# Patient Record
Sex: Male | Born: 1965
Health system: Southern US, Community
[De-identification: ages and names within clinical notes are randomized; demographics above are authoritative.]

## PROBLEM LIST (undated history)

## (undated) DIAGNOSIS — E785 Hyperlipidemia, unspecified: Secondary | ICD-10-CM

## (undated) HISTORY — DX: Morbid (severe) obesity due to excess calories: E66.01

## (undated) HISTORY — DX: Hyperlipidemia, unspecified: E78.5

## (undated) HISTORY — PX: TONSILLECTOMY AND ADENOIDECTOMY: SUR1326

---

## 2015-03-05 ENCOUNTER — Ambulatory Visit (INDEPENDENT_AMBULATORY_CARE_PROVIDER_SITE_OTHER): Payer: BLUE CROSS/BLUE SHIELD | Admitting: Neurology

## 2015-03-05 ENCOUNTER — Encounter: Payer: Self-pay | Admitting: Neurology

## 2015-03-05 VITALS — BP 124/82 | HR 62 | Resp 18 | Ht 71.0 in | Wt 266.0 lb

## 2015-03-05 DIAGNOSIS — G4719 Other hypersomnia: Secondary | ICD-10-CM

## 2015-03-05 DIAGNOSIS — R51 Headache: Secondary | ICD-10-CM | POA: Diagnosis not present

## 2015-03-05 DIAGNOSIS — E669 Obesity, unspecified: Secondary | ICD-10-CM | POA: Diagnosis not present

## 2015-03-05 DIAGNOSIS — R351 Nocturia: Secondary | ICD-10-CM

## 2015-03-05 DIAGNOSIS — G4733 Obstructive sleep apnea (adult) (pediatric): Secondary | ICD-10-CM

## 2015-03-05 DIAGNOSIS — R519 Headache, unspecified: Secondary | ICD-10-CM

## 2015-03-05 NOTE — Progress Notes (Addendum)
Subjective:    Patient ID: Derek Villegas is a 49 y.o. male.  HPI     Huston Foley, MD, PhD [facility] [address] [address] [place], [place] [address]  Dear Sharlet Salina and Dr. Phillips Odor,  I saw your patient, Derek Villegas, upon your kind request in my neurologic clinic today for initial consultation of his sleep disorder, in particular, concern for underlying obstructive sleep apnea. The patient is unaccompanied today. As you know, Mr. Febo is a 49 year old right-handed gentleman with an underlying medical history of anxiety and hyperlipidemia, and obesity, who reports snoring and excessive daytime somnolence. He does not wake up rested. His wife has witnessed breathing pauses while he is asleep. Symptoms have been ongoing for about 3-4 years. He has rare morning headaches and has nocturia usually once per night. He denies frank restless leg symptoms and is not sure if he twitches his legs in his sleep. He has no family history of obstructive sleep apnea. He had a tonsillectomy when he was in third grade. He lives at home with his wife and 31 year old daughter. 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 Bedtime is typically between 10 and 10:30 PM. Rise time is usually at 5 AM. His Epworth sleepiness score is 12 out of 24. His fatigue scores 36 out of 63. He does not typically take any scheduled naps but has fallen asleep when he is sedentary at home. He does not smoke but uses smokeless tobacco, 2-3 cans per day. He does not drink alcohol and quit when his daughter was born. He drinks green see about 20 ounces per day and otherwise water. He does not watch TV in bed. He is trying to lose weight. He has lost about 10 pounds in the last couple months. He had overnight pulse oximetry test at your office in August 2016. We will request test results. He was told that he had drops in his oxygen saturation at  night.  03/05/2015: I received the overnight pulse oximetry test results from 01/10/2015: Total valid sampling time was 7 hours and 5 minutes, average oxygen saturation of only 87.6%, nadir was 69%, average pulse rate was 71, lowest pulse rate was 50, highest pulse rate was 113, time below 90% saturation was 4 hours and 20 minutes, time below 89% saturation was 3 hours and 38 minutes. Based on the test results he has significant nocturnal hypoxemia.  His Past Medical History Is Significant For: Past Medical History  Diagnosis Date  . Obesity, morbid   . Hyperlipemia     His Past Surgical History Is Significant For: Past Surgical History  Procedure Laterality Date  . Tonsillectomy and adenoidectomy      His Family History Is Significant For: Family History  Problem Relation Age of Onset  . Kidney failure Mother   . Hypertension Mother   . Diabetes Mother   . Hypertension Father   . Diabetes Father   . Hypertension Brother   . Diabetes Brother     His Social History Is Significant For: Social History   Social History  . Marital Status: Married    Spouse Name: N/A  . Number of Children: 1  . Years of Education: 12   Occupational History  . [employer] service manager     Social History Main Topics  . Smoking status: Former Games developer  . Smokeless tobacco: Current User  . Alcohol Use: No  .  Drug Use: No  . Sexual Activity: Not Asked   Other Topics Concern  . None   Social History Narrative   Denies caffeine use    His Allergies Are:  No Known Allergies:   His Current Medications Are:  Outpatient Encounter Prescriptions as of 03/05/2015  Medication Sig  . citalopram (CELEXA) 40 MG tablet Take 40 mg by mouth daily.  . pravastatin (PRAVACHOL) 20 MG tablet Take 20 mg by mouth daily.   No facility-administered encounter medications on file as of 03/05/2015.  :  Review of Systems:  Out of a complete 14 point review of systems, all are reviewed and negative with the  exception of these symptoms as listed below:   Review of Systems  Constitutional: Positive for fatigue.  Neurological:       No trouble falling or staying asleep, snoring, witnessed apnea, wakes up feeling tired, daytime tiredness, takes nap after work, sometimes wakes up with headaches.   Hematological: Bruises/bleeds easily.  Psychiatric/Behavioral:       Not enough sleep, decreased energy     Objective:  Neurologic Exam  Physical Exam Physical Examination:   Filed Vitals:   03/05/15 1349  BP: 124/82  Pulse: 62  Resp: 18    General Examination: The patient is a very pleasant 49 y.o. male in no acute distress. He appears well-developed and well-nourished and adequately groomed.   HEENT: Normocephalic, atraumatic, pupils are equal, round and reactive to light and accommodation. Funduscopic exam is normal with sharp disc margins noted. Extraocular tracking is good without limitation to gaze excursion or nystagmus noted. Normal smooth pursuit is noted. Hearing is grossly intact. Tympanic membranes are clear bilaterally. Face is symmetric with normal facial animation and normal facial sensation. Speech is clear with no dysarthria noted. There is no hypophonia. There is no lip, neck/head, jaw or voice tremor. Neck is supple with full range of passive and active motion. There are no carotid bruits on auscultation. Oropharynx exam reveals: mild mouth dryness, adequate marginal dental hygiene and moderate airway crowding, due to redundant soft palate, larger tongue, possible residual tonsillar tissue on the right and larger uvula. Mallampati is class II. Tongue protrudes centrally and palate elevates symmetrically. Tonsils are absent. Neck size is 17.25 inches. He has a Mild overbite. Nasal inspection reveals no significant nasal mucosal bogginess or redness and no septal deviation.   Chest: Clear to auscultation without wheezing, rhonchi or crackles noted.  Heart: S1+S2+0, regular and normal  without murmurs, rubs or gallops noted.   Abdomen: Soft, non-tender and non-distended with normal bowel sounds appreciated on auscultation.  Extremities: There is no pitting edema in the distal lower extremities bilaterally. Pedal pulses are intact.  Skin: Warm and dry without trophic changes noted. There are no varicose veins.  Musculoskeletal: exam reveals no obvious joint deformities, tenderness or joint swelling or erythema.   Neurologically:  Mental status: The patient is awake, alert and oriented in all 4 spheres. His immediate and remote memory, attention, language skills and fund of knowledge are appropriate. There is no evidence of aphasia, agnosia, apraxia or anomia. Speech is clear with normal prosody and enunciation. Thought process is linear. Mood is normal and affect is normal.  Cranial nerves II - XII are as described above under HEENT exam. In addition: shoulder shrug is normal with equal shoulder height noted. Motor exam: Normal bulk, strength and tone is noted. There is no drift, tremor or rebound. Romberg is negative. Reflexes are 2+ throughout. Babinski: Toes are flexor  bilaterally. Fine motor skills and coordination: intact with normal finger taps, normal hand movements, normal rapid alternating patting, normal foot taps and normal foot agility.  Cerebellar testing: No dysmetria or intention tremor on finger to nose testing. Heel to shin is unremarkable bilaterally. There is no truncal or gait ataxia.  Sensory exam: intact to light touch, pinprick, vibration, temperature sense in the upper and lower extremities.  Gait, station and balance: He stands easily. No veering to one side is noted. No leaning to one side is noted. Posture is age-appropriate and stance is narrow based. Gait shows normal stride length and normal pace. No problems turning are noted. He turns en bloc. Tandem walk is unremarkable.   Assessment and Plan:   In summary, JAMERE STIDHAM is a very pleasant 49  y.o.-year old male with an underlying medical history of anxiety and hyperlipidemia, and obesity, whose history and physical exam are in keeping with obstructive sleep apnea (OSA). I had a long chat with the patient about my findings and the diagnosis of OSA, its prognosis and treatment options. We talked about medical treatments, surgical interventions and non-pharmacological approaches. I explained in particular the risks and ramifications of untreated moderate to severe OSA, especially with respect to developing cardiovascular disease down the Road, including congestive heart failure, difficult to treat hypertension, cardiac arrhythmias, or stroke. Even type 2 diabetes has, in part, been linked to untreated OSA. Symptoms of untreated OSA include daytime sleepiness, memory problems, mood irritability and mood disorder such as depression and anxiety, lack of energy, as well as recurrent headaches, especially morning headaches. We talked about tobacco cessation, and trying to maintain a healthy lifestyle in general, as well as the importance of weight control. I encouraged the patient to eat healthy, exercise daily and keep well hydrated, to keep a scheduled bedtime and wake time routine, to not skip any meals and eat healthy snacks in between meals. I advised the patient not to drive when feeling sleepy. I recommended the following at this time: sleep study with potential positive airway pressure titration. (We will score hypopneas at 3% and split the sleep study into diagnostic and treatment portion, if the estimated. 2 hour AHI is >15/h).  We will also request his overnight pulse oximetry test results from your office from August 2016. Of note, I reviewed the test results later which indicate nocturnal hypoxemia.  I explained the sleep test procedure to the patient and also outlined possible surgical and non-surgical treatment options of OSA, including the use of a custom-made dental device (which would  require a referral to a specialist dentist or oral surgeon), upper airway surgical options, such as pillar implants, radiofrequency surgery, tongue base surgery, and UPPP (which would involve a referral to an ENT surgeon). Rarely, jaw surgery such as mandibular advancement may be considered.  I also explained the CPAP treatment option to the patient, who indicated that he would be willing to try CPAP if the need arises. I explained the importance of being compliant with PAP treatment, not only for insurance purposes but primarily to improve His symptoms, and for the patient's long term health benefit, including to reduce His cardiovascular risks. I answered all his questions today and the patient was in agreement. I would like to see him back after the sleep study is completed and encouraged him to call with any interim questions, concerns, problems or updates.   Thank you very much for allowing me to participate in the care of this nice patient. If  I can be of any further assistance to you please do not hesitate to call me at [phone].  Sincerely,   Star Age, MD, PhD

## 2015-03-05 NOTE — Patient Instructions (Signed)

## 2015-03-07 ENCOUNTER — Ambulatory Visit (INDEPENDENT_AMBULATORY_CARE_PROVIDER_SITE_OTHER): Payer: BLUE CROSS/BLUE SHIELD | Admitting: Neurology

## 2015-03-07 DIAGNOSIS — G4720 Circadian rhythm sleep disorder, unspecified type: Secondary | ICD-10-CM

## 2015-03-07 DIAGNOSIS — G479 Sleep disorder, unspecified: Secondary | ICD-10-CM

## 2015-03-07 DIAGNOSIS — G4733 Obstructive sleep apnea (adult) (pediatric): Secondary | ICD-10-CM | POA: Diagnosis not present

## 2015-03-08 NOTE — Sleep Study (Signed)
Please see the scanned sleep study interpretation located in the Procedure tab within the Chart Review section. 

## 2015-03-12 ENCOUNTER — Telehealth: Payer: Self-pay | Admitting: Neurology

## 2015-03-12 DIAGNOSIS — G4733 Obstructive sleep apnea (adult) (pediatric): Secondary | ICD-10-CM

## 2015-03-12 NOTE — Telephone Encounter (Signed)
Patient was referred by Sharlet Salina Mann/Dr. Phillips Odor, seen by me on 03/05/15, split night study on 03/07/15, ins: [employer]:  [place]:   Please call and notify patient that the recent sleep study confirmed the diagnosis of severe OSA. He did well with CPAP during the study with significant improvement of the respiratory events. Therefore, I would like start the patient on CPAP therapy at home by prescribing a machine for home use. I placed the order in the chart. The patient will need a follow up appointment with me in 8 to 10 weeks post set up that has to be scheduled; please go ahead and schedule while you have the patient on the phone and make sure patient understands the importance of keeping this window for the FU appointment, as it is often an insurance requirement and failing to adhere to this may result in losing coverage for sleep apnea treatment.   Please re-enforce the importance of compliance with treatment and the need for [place] to monitor compliance data - again an insurance requirement and good feedback for the patient as far as how they are doing.  Also remind patient, that any upcoming CPAP machine or mask issues, should be first addressed with the DME company. Please ask if patient has a preference regarding DME company.  Please arrange for CPAP set up at home through a DME company of patient's choice - once you have spoken to the patient - and faxed/routed report to PCP and referring MD (if other than PCP), you can close this encounter, thanks,   Huston Foley, MD, PhD [facility]

## 2015-03-13 NOTE — Telephone Encounter (Signed)
I spoke to patient and he is aware of results and recommendation. He would like to start CPAP and use [facility]. I will send orders to them. We made f/u appt in December. I will send patient a letter reminding him importance of compliance. I will fax study to PCP.

## 2015-03-13 NOTE — Telephone Encounter (Signed)
Left message to call back  

## 2015-03-13 NOTE — Telephone Encounter (Signed)
Patient returned your call.

## 2015-03-13 NOTE — Telephone Encounter (Signed)
Left message to cal back 

## 2015-05-14 ENCOUNTER — Telehealth: Payer: Self-pay

## 2015-05-14 ENCOUNTER — Ambulatory Visit: Payer: Self-pay | Admitting: Neurology

## 2015-05-14 NOTE — Telephone Encounter (Signed)
Patient did not show to appt today  

## 2015-05-15 ENCOUNTER — Encounter: Payer: Self-pay | Admitting: Neurology

## 2015-05-22 ENCOUNTER — Encounter: Payer: Self-pay | Admitting: Neurology

## 2015-05-22 ENCOUNTER — Ambulatory Visit (INDEPENDENT_AMBULATORY_CARE_PROVIDER_SITE_OTHER): Payer: BLUE CROSS/BLUE SHIELD | Admitting: Neurology

## 2015-05-22 VITALS — BP 140/82 | HR 70 | Resp 20 | Ht 71.0 in | Wt 270.0 lb

## 2015-05-22 DIAGNOSIS — R519 Headache, unspecified: Secondary | ICD-10-CM

## 2015-05-22 DIAGNOSIS — E669 Obesity, unspecified: Secondary | ICD-10-CM

## 2015-05-22 DIAGNOSIS — R351 Nocturia: Secondary | ICD-10-CM | POA: Diagnosis not present

## 2015-05-22 DIAGNOSIS — G4733 Obstructive sleep apnea (adult) (pediatric): Secondary | ICD-10-CM | POA: Diagnosis not present

## 2015-05-22 DIAGNOSIS — R51 Headache: Secondary | ICD-10-CM | POA: Diagnosis not present

## 2015-05-22 NOTE — Patient Instructions (Signed)

## 2015-05-22 NOTE — Progress Notes (Signed)
Subjective:    Patient ID: Derek Villegas is a 49 y.o. male.  HPI     Interim history:   Derek Villegas is a 49 year old right-handed gentleman with an underlying medical history of anxiety and hyperlipidemia, and obesity, who presents for follow-up consultation of his obstructive sleep apnea, after his recent sleep study, and referral for CPAP therapy at home. The patient is unaccompanied today. Of note, he no showed for an appointment on 05/14/15. I first met him on 03/05/2015 at the request of his primary care physician, at which time he reported snoring and excessive daytime somnolence. I invited him back for sleep study. He had a split-night sleep study on 03/07/2015 and I went over his test results with him in detail today. His baseline sleep efficiency was 74.7% with a sleep latency of 15 minutes and wake after sleep onset of 14 minutes. He had absence of slow-wave sleep and REM sleep was 32.5% with a normal REM latency. He had no significant PLMS, EKG or EEG changes. He had moderate to loud snoring. Total AHI was elevated at 52.7 per hour, average oxygen saturation 92%, nadir was 79%. Time below 88% saturation was 23 minutes. He was then titrated on CPAP. Sleep efficiency was 84.6% during the second part of the study. Sleep latency 6 minutes and wake after sleep onset was 30 minutes. He had absence of slow-wave sleep and REM sleep was 12%. Average oxygen saturation was 93%, nadir was 84%, time below 88% saturation was 1 minute and 44 seconds, he had no significant PLMS. CPAP was titrated from 5 cm to 10 cm of water pressure and his AHI was slightly elevated at 7.7 per hour at the final pressure. Based on his test results I suggested we start him on CPAP therapy at a pressure of 11 cm.  Today, 05/22/2015: I reviewed his CPAP compliance data from 04/09/2015 through 05/08/2015 which is a total of 30 days during which time he used his machine only 5 days with percent used days greater than 4 hours of only  17%, indicating poor compliance with an average usage of only 1 hour and 7 minutes, residual AHI 6.5 per hour, leak at times high with the 95th percentile at 42 L/m on a pressure of 11 cm with EPR of 3. His 50 day compliance data from 03/20/2015 through 05/08/2015 shows a compliance percentage of 32%. AHI was 5 per hour, leak more acceptable at the time.  Today, 05/22/2015: He reports that he had no problems using the CPAP but he admits that he has lapsed in treatment. He actually endorses sleeping better and waking up better rested when he did use his CPAP. He has essentially stopped using it on 05/08/2015. He really had big gaps within the last 30 day download. He has gained a little bit of weight. He has no new medical issues. He has had no recent medication changes. He continues to work full-time, Games developer. He endorses nocturia and morning headaches.  Previously:  03/05/2015: He reports snoring and excessive daytime somnolence. He does not wake up rested. His wife has witnessed breathing pauses while he is asleep. Symptoms have been ongoing for about 3-4 years. He has rare morning headaches and has nocturia usually once per night. He denies frank restless leg symptoms and is not sure if he twitches his legs in his sleep. He has no family history of obstructive sleep apnea. He had a tonsillectomy when he was in third grade. He lives at home with his  wife and 53 year old daughter. 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 Bedtime is typically between 10 and 10:30 PM. Rise time is usually at 5 AM. His Epworth sleepiness score is 12 out of 24. His fatigue scores 36 out of 63. He does not typically take any scheduled naps but has fallen asleep when he is sedentary at home. He does not smoke but uses smokeless tobacco, 2-3 cans per day. He does not drink alcohol and quit when his daughter was born. He drinks green see about 20 ounces per day and  otherwise water. He does not watch TV in bed. He is trying to lose weight. He has lost about 10 pounds in the last couple months. He had overnight pulse oximetry test at your office in August 2016. We will request test results. He was told that he had drops in his oxygen saturation at night.  03/05/2015: I received the overnight pulse oximetry test results from 01/10/2015: Total valid sampling time was 7 hours and 5 minutes, average oxygen saturation of only 87.6%, nadir was 69%, average pulse rate was 71, lowest pulse rate was 50, highest pulse rate was 113, time below 90% saturation was 4 hours and 20 minutes, time below 89% saturation was 3 hours and 38 minutes. Based on the test results he has significant nocturnal hypoxemia.  His Past Medical History Is Significant For: Past Medical History  Diagnosis Date  . Obesity, morbid ([place])   . Hyperlipemia     His Past Surgical History Is Significant For: Past Surgical History  Procedure Laterality Date  . Tonsillectomy and adenoidectomy      His Family History Is Significant For: Family History  Problem Relation Age of Onset  . Kidney failure Mother   . Hypertension Mother   . Diabetes Mother   . Hypertension Father   . Diabetes Father   . Hypertension Brother   . Diabetes Brother     His Social History Is Significant For: Social History   Social History  . Marital Status: Married    Spouse Name: N/A  . Number of Children: 1  . Years of Education: 12   Occupational History  . [employer] manager     Social History Main Topics  . Smoking status: Former Research scientist (life sciences)  . Smokeless tobacco: Current User  . Alcohol Use: No  . Drug Use: No  . Sexual Activity: Not Asked   Other Topics Concern  . None   Social History Narrative   Denies caffeine use    His Allergies Are:  No Known Allergies:   His Current Medications Are:  Outpatient Encounter Prescriptions as of 05/22/2015  Medication Sig  . citalopram (CELEXA) 40 MG  tablet Take 40 mg by mouth daily.  . pravastatin (PRAVACHOL) 20 MG tablet Take 20 mg by mouth daily.   No facility-administered encounter medications on file as of 05/22/2015.  :  Review of Systems:  Out of a complete 14 point review of systems, all are reviewed and negative with the exception of these symptoms as listed below:  Review of Systems  Neurological:       Patient is here for initial CPAP f/u. Feels like he is doing well on it, no new concerns.     Objective:  Neurologic Exam  Physical Exam Physical Examination:   Filed Vitals:   05/22/15 1257  BP: 140/82  Pulse: 70  Resp: 20  General Examination: The patient is a very pleasant 49 y.o. male in no acute distress. He appears well-developed and well-nourished and adequately groomed. He is in good spirits today.  HEENT: Normocephalic, atraumatic, pupils are equal, round and reactive to light and accommodation. Funduscopic exam is normal with sharp disc margins noted. Extraocular tracking is good without limitation to gaze excursion or nystagmus noted. Normal smooth pursuit is noted. Hearing is grossly intact. Face is symmetric with normal facial animation and normal facial sensation. Speech is clear with no dysarthria noted. There is no hypophonia. There is no lip, neck/head, jaw or voice tremor. Neck is supple with full range of passive and active motion. There are no carotid bruits on auscultation. Oropharynx exam reveals: mild mouth dryness, adequate marginal dental hygiene and moderate airway crowding, due to redundant soft palate, larger tongue, possible residual tonsillar tissue on the right and larger uvula. Mallampati is class II. Tongue protrudes centrally and palate elevates symmetrically. Tonsils are absent. He has a Mild overbite. Nasal inspection reveals no significant nasal mucosal bogginess or redness and no septal deviation.   Chest: Clear to auscultation without wheezing, rhonchi or crackles noted.  Heart:  S1+S2+0, regular and normal without murmurs, rubs or gallops noted.   Abdomen: Soft, non-tender and non-distended with normal bowel sounds appreciated on auscultation.  Extremities: There is no pitting edema in the distal lower extremities bilaterally. Pedal pulses are intact.  Skin: Warm and dry without trophic changes noted. There are no varicose veins.  Musculoskeletal: exam reveals no obvious joint deformities, tenderness or joint swelling or erythema.   Neurologically:  Mental status: The patient is awake, alert and oriented in all 4 spheres. His immediate and remote memory, attention, language skills and fund of knowledge are appropriate. There is no evidence of aphasia, agnosia, apraxia or anomia. Speech is clear with normal prosody and enunciation. Thought process is linear. Mood is normal and affect is normal.  Cranial nerves II - XII are as described above under HEENT exam. In addition: shoulder shrug is normal with equal shoulder height noted. Motor exam: Normal bulk, strength and tone is noted. There is no drift, tremor or rebound. Romberg is negative. Reflexes are 2+ throughout. Babinski: Toes are flexor bilaterally. Fine motor skills and coordination: intact.  Sensory exam: intact to light touch, vibration, temperature sense in the upper and lower extremities.  Gait, station and balance: He stands easily. No veering to one side is noted. No leaning to one side is noted. Posture is age-appropriate and stance is narrow based. Gait shows normal stride length and normal pace. No problems turning are noted. He turns en bloc. Tandem walk is unremarkable.   Assessment and Plan:   In summary, Derek Villegas is a very pleasant 49 year old male with an underlying medical history of anxiety and hyperlipidemia, and obesity, who presents for follow-up consultation of his severe obstructive sleep apnea. He had a split-night sleep study in September 2016 we talked about the test results in detail.  We talked about his compliance and compliance data download as well. He's currently noncompliant with treatment but motivated to restart treatment. He actually endorses feeling better when he was using his CPAP. He is advised about the risks and ramifications of  untreated moderate to severe OSA, especially with respect to developing cardiovascular disease down the Road, including congestive heart failure, difficult to treat hypertension, cardiac arrhythmias, or stroke. Even type 2 diabetes has, in part, been linked to untreated OSA. Symptoms of untreated OSA include  daytime sleepiness, memory problems, mood irritability and mood disorder such as depression and anxiety, lack of energy, as well as recurrent headaches, especially morning headaches. We talked about tobacco cessation, and trying to maintain a healthy lifestyle in general, as well as the importance of weight control. I encouraged the patient to eat healthy, exercise daily and keep well hydrated, to keep a scheduled bedtime and wake time routine, to not skip any meals and eat healthy snacks in between meals. I advised the patient not to drive when feeling sleepy. I recommended the following at this time: We start CPAP therapy and strive for weight loss. Thankfully, he was able to tolerate the mask and the pressure and does not have any problems using CPAP and is motivated to restart it.  Alternative treatments may include upper airway surgery or a custom-made dental device but the patient is willing to retry CPAP therapy which we will pursue at this time. I explained the importance of being compliant with PAP treatment, not only for insurance purposes but primarily to improve His symptoms, and for the patient's long term health benefit, including to reduce His cardiovascular risks. I would like to see him back in 3 months, sooner if the need arises. I answered all his questions today and the patient was in agreement. I encouraged him to call with any  interim questions, concerns, problems or updates.  I spent 20 minutes in total face-to-face time with the patient, more than 50% of which was spent in counseling and coordination of care, reviewing test results, reviewing medication and discussing or reviewing the diagnosis of OSA, its prognosis and treatment options.

## 2015-06-16 ENCOUNTER — Emergency Department (HOSPITAL_COMMUNITY)
Admission: EM | Admit: 2015-06-16 | Discharge: 2015-06-16 | Disposition: A | Discharge status: Home / Self Care | Payer: BLUE CROSS/BLUE SHIELD | Attending: Emergency Medicine | Admitting: Emergency Medicine

## 2015-06-16 ENCOUNTER — Encounter (HOSPITAL_COMMUNITY): Payer: Self-pay | Admitting: Emergency Medicine

## 2015-06-16 ENCOUNTER — Emergency Department (HOSPITAL_COMMUNITY): Payer: BLUE CROSS/BLUE SHIELD

## 2015-06-16 DIAGNOSIS — W009XXA Unspecified fall due to ice and snow, initial encounter: Secondary | ICD-10-CM | POA: Diagnosis not present

## 2015-06-16 DIAGNOSIS — S99912A Unspecified injury of left ankle, initial encounter: Secondary | ICD-10-CM | POA: Diagnosis present

## 2015-06-16 DIAGNOSIS — Y9389 Activity, other specified: Secondary | ICD-10-CM | POA: Insufficient documentation

## 2015-06-16 DIAGNOSIS — Z87891 Personal history of nicotine dependence: Secondary | ICD-10-CM | POA: Diagnosis not present

## 2015-06-16 DIAGNOSIS — Y9289 Other specified places as the place of occurrence of the external cause: Secondary | ICD-10-CM | POA: Diagnosis not present

## 2015-06-16 DIAGNOSIS — E785 Hyperlipidemia, unspecified: Secondary | ICD-10-CM | POA: Diagnosis not present

## 2015-06-16 DIAGNOSIS — S82832A Other fracture of upper and lower end of left fibula, initial encounter for closed fracture: Secondary | ICD-10-CM | POA: Diagnosis not present

## 2015-06-16 DIAGNOSIS — Y998 Other external cause status: Secondary | ICD-10-CM | POA: Insufficient documentation

## 2015-06-16 DIAGNOSIS — Z79899 Other long term (current) drug therapy: Secondary | ICD-10-CM | POA: Insufficient documentation

## 2015-06-16 MED ORDER — OXYCODONE-ACETAMINOPHEN 5-325 MG PO TABS: 1.0000 | ORAL_TABLET | Freq: Four times a day (QID) | ORAL | Status: DC | PRN

## 2015-06-16 NOTE — ED Notes (Signed)
PT c/o slipping on ice outside and c/o left ankle pain from fall. PT able to bear weight and ambulate in triage.

## 2015-06-16 NOTE — Discharge Instructions (Signed)
Ankle Fracture A fracture is a break in a bone. The ankle joint is made up of three bones. These include the lower (distal)sections of your lower leg bones, called the tibia and fibula, along with a bone in your foot, called the talus. Depending on how bad the break is and if more than one ankle joint bone is broken, a cast or splint is used to protect and keep your injured bone from moving while it heals. Sometimes, surgery is required to help the fracture heal properly.  There are two general types of fractures:  Stable fracture. This includes a single fracture line through one bone, with no injury to ankle ligaments. A fracture of the talus that does not have any displacement (movement of the bone on either side of the fracture line) is also stable.  Unstable fracture. This includes more than one fracture line through one or more bones in the ankle joint. It also includes fractures that have displacement of the bone on either side of the fracture line. CAUSES  A direct blow to the ankle.   Quickly and severely twisting your ankle.  Trauma, such as a car accident or falling from a significant height. RISK FACTORS You may be at a higher risk of ankle fracture if:  You have certain medical conditions.  You are involved in high-impact sports.  You are involved in a high-impact car accident. SIGNS AND SYMPTOMS   Tender and swollen ankle.  Bruising around the injured ankle.  Pain on movement of the ankle.  Difficulty walking or putting weight on the ankle.  A cold foot below the site of the ankle injury. This can occur if the blood vessels passing through your injured ankle were also damaged.  Numbness in the foot below the site of the ankle injury. DIAGNOSIS  An ankle fracture is usually diagnosed with a physical exam and X-rays. A CT scan may also be required for complex fractures. TREATMENT  Stable fractures are treated with a cast or splint and using crutches to avoid putting  weight on your injured ankle. This is followed by an ankle strengthening program. Some patients require a special type of cast, depending on other medical problems they may have. Unstable fractures require surgery to ensure the bones heal properly. Your health care provider will tell you what type of fracture you have and the best treatment for your condition. HOME CARE INSTRUCTIONS   Review correct crutch use with your health care provider and use your crutches as directed. Safe use of crutches is extremely important. Misuse of crutches can cause you to fall or cause injury to nerves in your hands or armpits.  Do not put weight or pressure on the injured ankle until directed by your health care provider.  To lessen the swelling, keep the injured leg elevated while sitting or lying down.  Apply ice to the injured area:  Put ice in a plastic bag.  Place a towel between your cast and the bag.  Leave the ice on for 20 minutes, 2-3 times a day.  If you have a plaster or fiberglass cast:  Do not try to scratch the skin under the cast with any objects. This can increase your risk of skin infection.  Check the skin around the cast every day. You may put lotion on any red or sore areas.  Keep your cast dry and clean.  If you have a plaster splint:  Wear the splint as directed.  You may loosen the elastic   around the splint if your toes become numb, tingle, or turn cold or blue.  Do not put pressure on any part of your cast or splint; it may break. Rest your cast only on a pillow the first 24 hours until it is fully hardened.  Your cast or splint can be protected during bathing with a plastic bag sealed to your skin with medical tape. Do not lower the cast or splint into water.  Take medicines as directed by your health care provider. Only take over-the-counter or prescription medicines for pain, discomfort, or fever as directed by your health care provider.  Do not drive a vehicle until  your health care provider specifically tells you it is safe to do so.  If your health care provider has given you a follow-up appointment, it is very important to keep that appointment. Not keeping the appointment could result in a chronic or permanent injury, pain, and disability. If you have any problem keeping the appointment, call the facility for assistance. SEEK MEDICAL CARE IF: You develop increased swelling or discomfort. SEEK IMMEDIATE MEDICAL CARE IF:   Your cast gets damaged or breaks.  You have continued severe pain.  You develop new pain or swelling after the cast was put on.  Your skin or toenails below the injury turn blue or gray.  Your skin or toenails below the injury feel cold, numb, or have loss of sensitivity to touch.  There is a bad smell or pus draining from under the cast. MAKE SURE YOU:   Understand these instructions.  Will watch your condition.  Will get help right away if you are not doing well or get worse.   This information is not intended to replace advice given to you by your health care provider. Make sure you discuss any questions you have with your health care provider.   Document Released: 05/22/2000 Document Revised: 05/30/2013 Document Reviewed: 12/22/2012 Elsevier Interactive Patient Education 2016 [employer]  

## 2015-06-16 NOTE — ED Provider Notes (Signed)
CSN: [id number]     Arrival date & time 06/16/15  1426 History   First MD Initiated Contact with Patient 06/16/15 1503     Chief Complaint  Patient presents with  . Ankle Injury     Patient is a 50 y.o. male presenting with lower extremity injury. The history is provided by the patient.  Ankle Injury Pertinent negatives include no chest pain and no abdominal pain.   patient presents with left ankle pain. States he twisted it stepping on the ice today. States that it bent backwards. No other injury. No numbness or weakness. States he's been able to walk on it but there is pain. Patient is on the outside of the left ankle. There is some swelling.  Past Medical History  Diagnosis Date  . Obesity, morbid ([facility])   . Hyperlipemia    Past Surgical History  Procedure Laterality Date  . Tonsillectomy and adenoidectomy     Family History  Problem Relation Age of Onset  . Kidney failure Mother   . Hypertension Mother   . Diabetes Mother   . Hypertension Father   . Diabetes Father   . Hypertension Brother   . Diabetes Brother    Social History  Substance Use Topics  . Smoking status: Former Games developer  . Smokeless tobacco: Current User  . Alcohol Use: No    Review of Systems  Constitutional: Negative for appetite change.  Respiratory: Negative for chest tightness.   Cardiovascular: Negative for chest pain.  Gastrointestinal: Negative for abdominal pain.  Genitourinary: Negative for dysuria.  Musculoskeletal: Negative for neck pain.  Neurological: Negative for weakness and numbness.      Allergies  Review of patient's allergies indicates no known allergies.  Home Medications   Prior to Admission medications   Medication Sig Start Date End Date Taking? Authorizing Provider  citalopram (CELEXA) 40 MG tablet Take 40 mg by mouth daily.    Historical Provider, MD  oxyCODONE-acetaminophen (PERCOCET/ROXICET) 5-325 MG tablet Take 1-2 tablets by mouth every 6 (six) hours as needed for  severe pain. 06/16/15   Benjiman Core, MD  pravastatin (PRAVACHOL) 20 MG tablet Take 20 mg by mouth daily.    Historical Provider, MD   BP 131/85 mmHg  Pulse 86  Temp(Src) 98 F (36.7 C) (Oral)  Resp 18  Ht 5\' 11"  (1.803 m)  Wt 260 lb (117.935 kg)  BMI 36.28 kg/m2  SpO2 95% Physical Exam  Constitutional: He appears well-developed.  HENT:  Head: Atraumatic.  Musculoskeletal: He exhibits tenderness.  Tenderness and swelling to left ankle laterally. Mild deformity. Strong dorsalis pedis pulse. Range of motion intact ankle. Sensation intact over foot with good capillary refill.  Neurological: He is alert.  Skin: Skin is warm.    ED Course  Procedures (including critical care time) Labs Review Labs Reviewed - No data to display  Imaging Review Dg Ankle Complete Left  06/16/2015  CLINICAL DATA:  Status post slip and fall on ice today with a left ankle injury pain. Initial encounter. EXAM: LEFT ANKLE COMPLETE - 3+ VIEW COMPARISON:  None. FINDINGS: The patient has a nondisplaced distal fibular fracture with associated soft tissue swelling. Tiny bony fragment projecting along the medial process of the talus is compatible with an avulsion fracture. No other acute bony or joint abnormality is seen. Pes planus deformity is noted. Talonavicular degenerative change is seen. There is a plantar calcaneal spur. IMPRESSION: Nondisplaced distal fibular fracture. Tiny avulsion fracture likely off the medial process of the talus  is also seen. Pes planus. Plantar calcaneal spur. Electronically Signed   By: Drusilla Kannerhomas  Dalessio M.D.   On: 06/16/2015 15:05   I have personally reviewed and evaluated these images and lab results as part of my medical decision-making.   EKG Interpretation None      MDM   Final diagnoses:  Fracture of distal end of left fibula    Patient with ankle fracture. Close. Cam walker and weight bear as tolerated.     Benjiman CoreNathan Larrisa Cravey, MD 06/16/15 [phone]

## 2015-06-18 ENCOUNTER — Ambulatory Visit (INDEPENDENT_AMBULATORY_CARE_PROVIDER_SITE_OTHER): Payer: BLUE CROSS/BLUE SHIELD | Admitting: Orthopedic Surgery

## 2015-06-18 VITALS — BP 119/86 | Ht 71.0 in | Wt 260.0 lb

## 2015-06-18 DIAGNOSIS — S8262XA Displaced fracture of lateral malleolus of left fibula, initial encounter for closed fracture: Secondary | ICD-10-CM | POA: Diagnosis not present

## 2015-06-18 MED ORDER — HYDROCODONE-ACETAMINOPHEN 7.5-325 MG PO TABS: 1.0000 | ORAL_TABLET | ORAL | Status: DC | PRN

## 2015-06-18 NOTE — Progress Notes (Signed)
Chief Complaint  Patient presents with  . Ankle Injury    er follow up , Left ankle fx, DOI 06/16/15    History this is a 50 year old male who sustained a fall and injured his left ankle. He reports pain in the left ankle and foot. Quality dull ache. Severity moderate to severe. Duration 2 days. Timing constant. Context secondary to fall. Modifying factors Cam Walker and pain medicine improve symptoms  Review of systems negative for neurologic symptoms negative for constitutional symptoms negative for any skin issues related to the fracture  Past Medical History  Diagnosis Date  . Obesity, morbid ([facility])   . Hyperlipemia     BP 119/86 mmHg  Ht 5\' 11"  (1.803 m)  Wt 260 lb (117.935 kg)  BMI 36.28 kg/m2  He is awake alert and oriented 3 mood and affect is normal his gait is partial weightbearing with crutches and Cam Walker  Overall appearance normal  Left foot and ankle tender over the fibula swelling of the foot and the ankle. Range of motion is limited to neutral plantigrade position stability tests deferred but no subluxation noted. Motor exam shows normal muscle tone  Skin is intact  Pulses are normal  Sensation is normal  X-rays are reviewed and I interpret the x-ray as a nondisplaced Weber B lateral malleolus fracture  Weight-bear as tolerated and Cam Walker  X-ray every 4 weeks until the fracture heals  Out of work 4 weeks if no needed  Norco for pain

## 2015-07-22 ENCOUNTER — Ambulatory Visit (INDEPENDENT_AMBULATORY_CARE_PROVIDER_SITE_OTHER): Payer: BLUE CROSS/BLUE SHIELD

## 2015-07-22 ENCOUNTER — Ambulatory Visit (INDEPENDENT_AMBULATORY_CARE_PROVIDER_SITE_OTHER): Payer: Self-pay | Admitting: Orthopedic Surgery

## 2015-07-22 ENCOUNTER — Encounter: Payer: Self-pay | Admitting: Orthopedic Surgery

## 2015-07-22 VITALS — BP 128/80 | Ht 71.0 in | Wt 276.0 lb

## 2015-07-22 DIAGNOSIS — S82892A Other fracture of left lower leg, initial encounter for closed fracture: Secondary | ICD-10-CM

## 2015-07-22 NOTE — Patient Instructions (Signed)
Wear boot x 4 weeks

## 2015-07-22 NOTE — Progress Notes (Signed)
Chief Complaint  Patient presents with  . Follow-up    FOLLOW UP + XRAY LEFT ANKLE FX, DOI 06/16/15   History this is a 50 year old male who sustained a fall and injured his left ankle. He reports pain in the left ankle and foot. Quality dull ache. Severity moderate to severe. Duration 2 days. Timing constant. Context secondary to fall. Modifying factors Cam Walker and pain medicine improve symptoms  He is here for follow-up x-ray and evaluation  TREATMENT   Weight-bear as tolerated and Cam Walker  X-ray every 4 weeks until the fracture heals  Out of work 4 weeks if no needed  Norco for pain

## 2015-08-19 ENCOUNTER — Ambulatory Visit (INDEPENDENT_AMBULATORY_CARE_PROVIDER_SITE_OTHER): Payer: BLUE CROSS/BLUE SHIELD | Admitting: Orthopedic Surgery

## 2015-08-19 ENCOUNTER — Ambulatory Visit (INDEPENDENT_AMBULATORY_CARE_PROVIDER_SITE_OTHER): Payer: BLUE CROSS/BLUE SHIELD

## 2015-08-19 ENCOUNTER — Encounter: Payer: Self-pay | Admitting: Orthopedic Surgery

## 2015-08-19 VITALS — BP 134/83 | Ht 71.0 in | Wt 276.0 lb

## 2015-08-19 DIAGNOSIS — S82892A Other fracture of left lower leg, initial encounter for closed fracture: Secondary | ICD-10-CM

## 2015-08-19 DIAGNOSIS — S8262XD Displaced fracture of lateral malleolus of left fibula, subsequent encounter for closed fracture with routine healing: Secondary | ICD-10-CM

## 2015-08-19 NOTE — Progress Notes (Signed)
Patient ID: Derek CraverJohn W Villegas, male   DOB: 04/09/1966, 50 y.o.   MRN: [id number]  Chief Complaint  Patient presents with  . Follow-up    FOLLOW UP + XRAY LEFT ANKLE FX, DOI 06/16/15    HPI 8 weeks post fibular fracture treated with Cam Walker  ROS  BP 134/83 mmHg  Ht 5\' 11"  (1.803 m)  Wt 276 lb (125.193 kg)  BMI 38.51 kg/m2  No tenderness at fracture site  X-ray shows fracture line ankle mortise intact    ASSESSMENT AND PLAN   Switch to [employer] weight-bear as tolerated x-ray 4 weeks

## 2015-08-19 NOTE — Patient Instructions (Signed)
Air cast for walking okay to take off in the house

## 2015-08-21 ENCOUNTER — Ambulatory Visit (INDEPENDENT_AMBULATORY_CARE_PROVIDER_SITE_OTHER): Payer: BLUE CROSS/BLUE SHIELD | Admitting: Neurology

## 2015-08-21 ENCOUNTER — Encounter: Payer: Self-pay | Admitting: Neurology

## 2015-08-21 VITALS — BP 136/86 | HR 76 | Resp 18 | Ht 71.0 in | Wt 284.0 lb

## 2015-08-21 DIAGNOSIS — E669 Obesity, unspecified: Secondary | ICD-10-CM

## 2015-08-21 DIAGNOSIS — R635 Abnormal weight gain: Secondary | ICD-10-CM

## 2015-08-21 DIAGNOSIS — Z9989 Dependence on other enabling machines and devices: Principal | ICD-10-CM

## 2015-08-21 DIAGNOSIS — S82892A Other fracture of left lower leg, initial encounter for closed fracture: Secondary | ICD-10-CM

## 2015-08-21 DIAGNOSIS — G4733 Obstructive sleep apnea (adult) (pediatric): Secondary | ICD-10-CM | POA: Diagnosis not present

## 2015-08-21 NOTE — Patient Instructions (Addendum)
Please continue using your CPAP regularly. While your insurance requires that you use CPAP at least 4 hours each night on 70% of the nights, I recommend, that you not skip any nights and use it throughout the night if you can. Getting used to CPAP and staying with the treatment long term does take time and patience and discipline. Untreated obstructive sleep apnea when it is moderate to severe can have an adverse impact on cardiovascular health and raise her risk for heart disease, arrhythmias, hypertension, congestive heart failure, stroke and diabetes. Untreated obstructive sleep apnea causes sleep disruption, nonrestorative sleep, and sleep deprivation. This can have an impact on your day to day functioning and cause daytime sleepiness and impairment of cognitive function, memory loss, mood disturbance, and problems focussing. Using CPAP regularly can improve these symptoms.  Keep up the good work! I will see you back in 6 months for sleep apnea check up, and if you continue to do well on CPAP I will see you once a year thereafter.    Please reduce your salt and soda intake, drink more water.   Try to lose weight.

## 2015-08-21 NOTE — Progress Notes (Signed)
Subjective:    Patient ID: Derek Villegas is a 50 y.o. male.  HPI     Interim history:   Mr. Derek Villegas is a 50 year old right-handed gentleman with an underlying medical history of anxiety and hyperlipidemia, and obesity, who presents for follow-up consultation of his obstructive sleep apnea, after his recent sleep study, and referral for CPAP therapy at home. The patient is accompanied by his 78 yo daughter today. I last saw him on 05/22/2015, at which time he was not compliant with his CPAP. He had stopped using CPAP at the end of November 2016 and also had some interim weight gain. He did endorse sleeping better with it. I encouraged him strongly to be compliant with CPAP therapy.   Today, 08/21/2015: I reviewed his CPAP compliance data from 07/20/2015 through 08/18/2015 which is a total of 30 days during which time he used his machine 25 days with percent used days greater than 4 hours at 83%, indicating very good compliance with an average usage of 5 hours and 21 minutes, residual AHI 4.1 per hour, leak acceptable with the 95th percentile at 15.7 L/m on a pressure of 11 cm with EPR of 3.   Today, 08/21/2015: He reports doing better, feels better rested, skipped just a few nights. Unfortunately, he fell on ice in January and fractured his left ankle. He wore a boot and is now in a brace for the next additional 4 weeks. He sees Dr. Aline Brochure in orthopedics in [place]. Since then, he has gained some weight. He has gained over 15 pounds in 3 months, not being very active. He does endorse drinking regular sodas and eating unhealthy snacks as well.  Previously:  Of note, he no showed for an appointment on 05/14/15. I first met him on 03/05/2015 at the request of his primary care physician, at which time he reported snoring and excessive daytime somnolence. I invited him back for sleep study. He had a split-night sleep study on 03/07/2015 and I went over his test results with him in detail today. His  baseline sleep efficiency was 74.7% with a sleep latency of 15 minutes and wake after sleep onset of 14 minutes. He had absence of slow-wave sleep and REM sleep was 32.5% with a normal REM latency. He had no significant PLMS, EKG or EEG changes. He had moderate to loud snoring. Total AHI was elevated at 52.7 per hour, average oxygen saturation 92%, nadir was 79%. Time below 88% saturation was 23 minutes. He was then titrated on CPAP. Sleep efficiency was 84.6% during the second part of the study. Sleep latency 6 minutes and wake after sleep onset was 30 minutes. He had absence of slow-wave sleep and REM sleep was 12%. Average oxygen saturation was 93%, nadir was 84%, time below 88% saturation was 1 minute and 44 seconds, he had no significant PLMS. CPAP was titrated from 5 cm to 10 cm of water pressure and his AHI was slightly elevated at 7.7 per hour at the final pressure. Based on his test results I suggested we start him on CPAP therapy at a pressure of 11 cm.  I reviewed his CPAP compliance data from 04/09/2015 through 05/08/2015 which is a total of 30 days during which time he used his machine only 5 days with percent used days greater than 4 hours of only 17%, indicating poor compliance with an average usage of only 1 hour and 7 minutes, residual AHI 6.5 per hour, leak at times high with the 95th percentile  at 42 L/m on a pressure of 11 cm with EPR of 3. His 50 day compliance data from 03/20/2015 through 05/08/2015 shows a compliance percentage of 32%. AHI was 5 per hour, leak more acceptable at the time.  03/05/2015: He reports snoring and excessive daytime somnolence. He does not wake up rested. His wife has witnessed breathing pauses while he is asleep. Symptoms have been ongoing for about 3-4 years. He has rare morning headaches and has nocturia usually once per night. He denies frank restless leg symptoms and is not sure if he twitches his legs in his sleep. He has no family history of obstructive  sleep apnea. He had a tonsillectomy when he was in third grade. He lives at home with his wife and 65 year old daughter. 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 Bedtime is typically between 10 and 10:30 PM. Rise time is usually at 5 AM. His Epworth sleepiness score is 12 out of 24. His fatigue scores 36 out of 63. He does not typically take any scheduled naps but has fallen asleep when he is sedentary at home. He does not smoke but uses smokeless tobacco, 2-3 cans per day. He does not drink alcohol and quit when his daughter was born. He drinks green see about 20 ounces per day and otherwise water. He does not watch TV in bed. He is trying to lose weight. He has lost about 10 pounds in the last couple months. He had overnight pulse oximetry test at your office in August 2016. We will request test results. He was told that he had drops in his oxygen saturation at night.  03/05/2015: I received the overnight pulse oximetry test results from 01/10/2015: Total valid sampling time was 7 hours and 5 minutes, average oxygen saturation of only 87.6%, nadir was 69%, average pulse rate was 71, lowest pulse rate was 50, highest pulse rate was 113, time below 90% saturation was 4 hours and 20 minutes, time below 89% saturation was 3 hours and 38 minutes. Based on the test results he has significant nocturnal hypoxemia.  His Past Medical History Is Significant For: Past Medical History  Diagnosis Date  . Obesity, morbid ([place])   . Hyperlipemia     His Past Surgical History Is Significant For: Past Surgical History  Procedure Laterality Date  . Tonsillectomy and adenoidectomy      His Family History Is Significant For: Family History  Problem Relation Age of Onset  . Kidney failure Mother   . Hypertension Mother   . Diabetes Mother   . Hypertension Father   . Diabetes Father   . Hypertension Brother   . Diabetes Brother     His Social History  Is Significant For: Social History   Social History  . Marital Status: Married    Spouse Name: N/A  . Number of Children: 1  . Years of Education: 12   Occupational History  . [employer]     Social History Main Topics  . Smoking status: Former Research scientist (life sciences)  . Smokeless tobacco: Current User  . Alcohol Use: No  . Drug Use: No  . Sexual Activity: Not Asked   Other Topics Concern  . None   Social History Narrative   Denies caffeine use    His Allergies Are:  No Known Allergies:   His Current Medications Are:  Outpatient Encounter Prescriptions as of 08/21/2015  Medication Sig  . citalopram (  CELEXA) 40 MG tablet Take 40 mg by mouth daily.  . pravastatin (PRAVACHOL) 20 MG tablet Take 20 mg by mouth daily.   No facility-administered encounter medications on file as of 08/21/2015.  :  Review of Systems:  Out of a complete 14 point review of systems, all are reviewed and negative with the exception of these symptoms as listed below:   Review of Systems  Neurological:       No new problems or complaints     Objective:  Neurologic Exam  Physical Exam Physical Examination:   Filed Vitals:   08/21/15 1608  BP: 136/86  Pulse: 76  Resp: 18   General Examination: The patient is a very pleasant 50 y.o. male in no acute distress. He appears well-developed and well-nourished and adequately groomed. He is in good spirits today.  HEENT: Normocephalic, atraumatic, pupils are equal, round and reactive to light and accommodation. Funduscopic exam is normal with sharp disc margins noted. Extraocular tracking is good without limitation to gaze excursion or nystagmus noted. Normal smooth pursuit is noted. Hearing is grossly intact. Face is symmetric with normal facial animation and normal facial sensation. Speech is clear with no dysarthria noted. There is no hypophonia. There is no lip, neck/head, jaw or voice tremor. Neck is supple with full range of passive and active motion.  There are no carotid bruits on auscultation. Oropharynx exam reveals: mild mouth dryness, adequate marginal dental hygiene and moderate airway crowding, due to redundant soft palate, larger tongue, possible residual tonsillar tissue on the right and larger uvula. Mallampati is class II. Tongue protrudes centrally and palate elevates symmetrically. Tonsils are absent.   Chest: Clear to auscultation without wheezing, rhonchi or crackles noted.  Heart: S1+S2+0, regular and normal without murmurs, rubs or gallops noted.   Abdomen: Soft, non-tender and non-distended with normal bowel sounds appreciated on auscultation.  Extremities: There is no trace edema in both ankles. This seems to be new. He does admit to eating salty snacks lately. Pedal pulses are intact.  Skin: Warm and dry without trophic changes noted. There are no varicose veins.  Musculoskeletal: exam reveals no obvious joint deformities, tenderness or joint swelling or erythema, with the exception of mild left ankle tenderness and he is wearing a Velcro brace.   Neurologically:  Mental status: The patient is awake, alert and oriented in all 4 spheres. His immediate and remote memory, attention, language skills and fund of knowledge are appropriate. There is no evidence of aphasia, agnosia, apraxia or anomia. Speech is clear with normal prosody and enunciation. Thought process is linear. Mood is normal and affect is normal.  Cranial nerves II - XII are as described above under HEENT exam. In addition: shoulder shrug is normal with equal shoulder height noted. Motor exam: Normal bulk, strength and tone is noted. There is no drift, tremor or rebound. Romberg is negative. Reflexes are 2+ throughout. Fine motor skills and coordination: intact.  Sensory exam: intact to light touch in the upper and lower extremities.  Gait, station and balance: He stands easily. No veering to one side is noted. No leaning to one side is noted. Posture is  age-appropriate and stance is narrow based. Gait shows normal stride length and normal pace. No problems turning are noted. He turns en bloc. Tandem walk is not possible today.    Assessment and Plan:   In summary, Derek Villegas is a very pleasant 50 year old male with an underlying medical history of anxiety and hyperlipidemia, and  obesity, who presents for follow-up consultation of his severe obstructive sleep apnea. He had a split-night sleep study in September 2016 we talked about the test results again today and I reviewed his most recent compliance data with him today. He has increased his usage and is much better with his compliance. He is congratulated on his treatment adherence and reminded to not skip any days. He endorses feeling better with respect to his sleep quality and daytime somnolence. He has gained weight in the last 3 months, particularly after his left ankle fracture. He is reminded to try to work on weight loss, increase his water intake, decrease his salty snack and soda intake.  He is again reminded about the risks and ramifications of untreated moderate to severe OSA, especially with respect to developing cardiovascular disease down the Road, including congestive heart failure, difficult to treat hypertension, cardiac arrhythmias, or stroke. Even type 2 diabetes has, in part, been linked to untreated OSA. Symptoms of untreated OSA include daytime sleepiness, memory problems, mood irritability and mood disorder such as depression and anxiety, lack of energy, as well as recurrent headaches, especially morning headaches. We talked about tobacco cessation, and trying to maintain a healthy lifestyle in general, as well as the importance of weight control. I encouraged the patient to eat healthy, exercise daily and keep well hydrated, to keep a scheduled bedtime and wake time routine, to not skip any meals and eat healthy snacks in between meals. I advised the patient not to drive when  feeling sleepy. I recommended the following at this time: continue to be compliant with CPAP therapy and strive for weight loss. Thankfully, he has been able to tolerate the mask and the pressure and does not have any problems using CPAP and is motivated to continue with it.  I explained the importance of being compliant with PAP treatment, not only for insurance purposes but primarily to improve His symptoms, and for the patient's long term health benefit, including to reduce His cardiovascular risks. I would like to see him back in 6 months, sooner if the need arises. I answered all his questions today and the patient was in agreement. I encouraged him to call with any interim questions, concerns, problems or updates.  I spent 20 minutes in total face-to-face time with the patient, more than 50% of which was spent in counseling and coordination of care, reviewing test results, reviewing medication and discussing or reviewing the diagnosis of OSA, its prognosis and treatment options.

## 2015-08-27 ENCOUNTER — Encounter: Payer: Self-pay | Admitting: *Deleted

## 2015-09-13 ENCOUNTER — Ambulatory Visit (INDEPENDENT_AMBULATORY_CARE_PROVIDER_SITE_OTHER): Payer: BLUE CROSS/BLUE SHIELD

## 2015-09-13 ENCOUNTER — Encounter: Payer: Self-pay | Admitting: Orthopedic Surgery

## 2015-09-13 ENCOUNTER — Ambulatory Visit (INDEPENDENT_AMBULATORY_CARE_PROVIDER_SITE_OTHER): Payer: Self-pay | Admitting: Orthopedic Surgery

## 2015-09-13 VITALS — BP 123/78 | Ht 71.0 in | Wt 284.0 lb

## 2015-09-13 DIAGNOSIS — S82892A Other fracture of left lower leg, initial encounter for closed fracture: Secondary | ICD-10-CM

## 2015-09-13 NOTE — Progress Notes (Signed)
Chief Complaint  Patient presents with  . Follow-up    4 week follow up + xray Lt distal fibula fracture, DOI 06/16/15    BP 123/78 mmHg  Ht 5\' 11"  (1.803 m)  Wt 284 lb (128.822 kg)  BMI 39.63 kg/m2  This is 12 weeks post fractured fibula. The patient feels very good he is walking without major support we did put him in an Aircast last visit his x-ray today shows the fracture shows good alignment and is essentially healed despite the fracture line noted on the lateral film. Clinical exam shows no tenderness and no weightbearing pain  He is released

## 2016-02-20 ENCOUNTER — Telehealth: Payer: Self-pay

## 2016-02-20 NOTE — Telephone Encounter (Signed)
Called both numbers. One was not working, the other no answer and no vm. I was trying to call patient to remind him to bring in CPAP or SD card to appt on Monday.

## 2016-02-24 ENCOUNTER — Ambulatory Visit: Payer: BLUE CROSS/BLUE SHIELD | Admitting: Neurology

## 2016-02-24 ENCOUNTER — Telehealth: Payer: Self-pay

## 2016-02-24 NOTE — Telephone Encounter (Signed)
Patient did not show to appt today  

## 2016-02-28 ENCOUNTER — Encounter: Payer: Self-pay | Admitting: Neurology

## 2017-09-22 ENCOUNTER — Encounter: Payer: Self-pay | Admitting: Internal Medicine

## 2017-10-27 ENCOUNTER — Ambulatory Visit: Payer: BLUE CROSS/BLUE SHIELD

## 2017-10-27 ENCOUNTER — Telehealth: Payer: Self-pay | Admitting: Internal Medicine

## 2017-10-27 ENCOUNTER — Encounter: Payer: Self-pay | Admitting: Internal Medicine

## 2017-10-27 NOTE — Telephone Encounter (Signed)
noted 

## 2017-10-27 NOTE — Telephone Encounter (Signed)
Patient was a no show and letter sent  °

## 2017-11-16 ENCOUNTER — Encounter: Payer: Self-pay | Admitting: Cardiovascular Disease

## 2017-11-26 ENCOUNTER — Ambulatory Visit (INDEPENDENT_AMBULATORY_CARE_PROVIDER_SITE_OTHER): Payer: Commercial Managed Care - PPO | Admitting: Cardiovascular Disease

## 2017-11-26 ENCOUNTER — Encounter: Payer: Self-pay | Admitting: Cardiovascular Disease

## 2017-11-26 VITALS — BP 124/86 | HR 84 | Ht 71.0 in | Wt 268.0 lb

## 2017-11-26 DIAGNOSIS — Z8249 Family history of ischemic heart disease and other diseases of the circulatory system: Secondary | ICD-10-CM | POA: Diagnosis not present

## 2017-11-26 DIAGNOSIS — E7800 Pure hypercholesterolemia, unspecified: Secondary | ICD-10-CM

## 2017-11-26 DIAGNOSIS — G4733 Obstructive sleep apnea (adult) (pediatric): Secondary | ICD-10-CM

## 2017-11-26 DIAGNOSIS — Z136 Encounter for screening for cardiovascular disorders: Secondary | ICD-10-CM | POA: Diagnosis not present

## 2017-11-26 DIAGNOSIS — Z9989 Dependence on other enabling machines and devices: Secondary | ICD-10-CM | POA: Diagnosis not present

## 2017-11-26 NOTE — Progress Notes (Signed)
CARDIOLOGY CONSULT NOTE  Patient ID: COLTRANE TUGWELL MRN: [id number] DOB/AGE: 1965-08-28 52 y.o.  Admit date: (Not on file) Primary Physician: [name], MD Referring Physician: Dr. Sherwood Gambler  Reason for Consultation: Cardiovascular risk assessment  HPI: [name] is a 52 y.o. male who is being seen today for a cardiovascular risk assessment at the request of [name], MD.   Past medical history includes hyperlipidemia, obesity, and obstructive sleep apnea.  I reviewed labs dated 10/12/2017: White blood cells 13.3, hemoglobin 14.8, platelets 344, BUN 14, creatinine 0.96, sodium 142, potassium 4.5, normal liver transaminases, total cholesterol 209, to glycerides 135, HDL 40, LDL 142, magnesium 2.2, TSH 3.84.  I reviewed ECGs from November 2012 which demonstrated sinus rhythm with no ischemic ST segment or T wave abnormalities, nor any arrhythmias.  ECG performed in the office today which I ordered and personally interpreted demonstrates normal sinus rhythm with no ischemic ST segment or T-wave abnormalities, nor any arrhythmias.  The patient denies any symptoms of chest pain, palpitations, shortness of breath, lightheadedness, dizziness, leg swelling, orthopnea, PND, and syncope.  He is a non-smoker.  Energy levels have remained stable over the last 12 months.  Family history: One brother has hypertension and coronary disease which he developed in his late 17s.  Another brother who has diabetes developed coronary disease and underwent CABG in his mid 37s.  No Known Allergies  Current Outpatient Medications  Medication Sig Dispense Refill  . aspirin EC 81 MG tablet Take 81 mg by mouth daily.    . citalopram (CELEXA) 40 MG tablet Take 40 mg by mouth daily.    Marland Kitchen co-enzyme Q-10 30 MG capsule Take 30 mg by mouth daily.    . Omega-3 Fatty Acids (FISH OIL) 1000 MG CAPS Take 1,000 mg by mouth daily.    . pravastatin (PRAVACHOL) 20 MG tablet Take 20 mg by mouth daily.      No current facility-administered medications for this visit.     Past Medical History:  Diagnosis Date  . Hyperlipemia   . Obesity, morbid [facility])     Past Surgical History:  Procedure Laterality Date  . TONSILLECTOMY AND ADENOIDECTOMY      Social History   Socioeconomic History  . Marital status: Married    Spouse name: Not on file  . Number of children: 1  . Years of education: 76  . Highest education level: Not on file  Occupational History  . Occupation: [employer], counselling  . Financial resource strain: Not on file  . Food insecurity:    Worry: Not on file    Inability: Not on file  . Transportation needs:    Medical: Not on file    Non-medical: Not on file  Tobacco Use  . Smoking status: Former Smoker    Last attempt to quit: 06/29/1987    Years since quitting: 30.4  . Smokeless tobacco: Current User  Substance and Sexual Activity  . Alcohol use: No    Alcohol/week: 0.0 oz  . Drug use: No  . Sexual activity: Not on file  Lifestyle  . Physical activity:    Days per week: Not on file    Minutes per session: Not on file  . Stress: Not on file  Relationships  . Social connections:    Talks on phone: Not on file    Gets together: Not on file    Attends religious service: Not on file  Active member of club or organization: Not on file    Attends meetings of clubs or organizations: Not on file    Relationship status: Not on file  . Intimate partner violence:    Fear of current or ex partner: Not on file    Emotionally abused: Not on file    Physically abused: Not on file    Forced sexual activity: Not on file  Other Topics Concern  . Not on file  Social History Narrative   Denies caffeine use      Current Meds  Medication Sig  . aspirin EC 81 MG tablet Take 81 mg by mouth daily.  . citalopram (CELEXA) 40 MG tablet Take 40 mg by mouth daily.  Marland Kitchen. co-enzyme Q-10 30 MG capsule Take 30 mg by mouth daily.  . Omega-3 Fatty Acids (FISH  OIL) 1000 MG CAPS Take 1,000 mg by mouth daily.  . pravastatin (PRAVACHOL) 20 MG tablet Take 20 mg by mouth daily.      Review of systems complete and found to be negative unless listed above in HPI    Physical exam Blood pressure 124/86, pulse 84, height 5\' 11"  (1.803 m), weight 268 lb (121.6 kg), SpO2 95 %. General: NAD Neck: No JVD, no thyromegaly or thyroid nodule.  Lungs: Clear to auscultation bilaterally with normal respiratory effort. CV: Nondisplaced PMI. Regular rate and rhythm, normal S1/S2, no S3/S4, no murmur.  No peripheral edema.  No carotid bruit.    Abdomen: Soft, nontender, no distention.  Skin: Intact without lesions or rashes.  Neurologic: Alert and oriented x 3.  Psych: Normal affect. Extremities: No clubbing or cyanosis.  HEENT: Normal.   ECG: Most recent ECG reviewed.   Labs: No results found for: K, BUN, CREATININE, ALT, TSH, HGB   Lipids: No results found for: LDLCALC, LDLDIRECT, CHOL, TRIG, HDL      ASSESSMENT AND PLAN:  1.  Cardiovascular risk assessment: He has a strong family history of premature coronary disease.  He has obstructive sleep apnea and hyperlipidemia.  I will arrange for him to undergo a coronary calcium score.  2.  Hyper lipidemia: Lipids reviewed above.  Currently on pravastatin.  3.  Obstructive sleep apnea: Uses CPAP regularly.     Disposition: Follow up to be determined after cardiac testing  Signed: Prentice DockerSuresh Koneswaran, M.D., F.A.C.C.  11/26/2017, 1:07 PM

## 2017-11-26 NOTE — Patient Instructions (Signed)
Medication Instructions:  Your physician recommends that you continue on your current medications as directed. Please refer to the Current Medication list given to you today.   Labwork: none  Testing/Procedures: Coronary Calcium Score  Follow-Up: Your physician recommends that you schedule a follow-up appointment in: as needed    Any Other Special Instructions Will Be Listed Below (If Applicable).     If you need a refill on your cardiac medications before your next appointment, please call your pharmacy.   

## 2017-12-10 ENCOUNTER — Ambulatory Visit (INDEPENDENT_AMBULATORY_CARE_PROVIDER_SITE_OTHER)
Admission: RE | Admit: 2017-12-10 | Discharge: 2017-12-10 | Disposition: A | Discharge status: Home / Self Care | Payer: Self-pay | Source: Ambulatory Visit | Attending: Cardiovascular Disease | Admitting: Cardiovascular Disease

## 2017-12-10 DIAGNOSIS — E7800 Pure hypercholesterolemia, unspecified: Secondary | ICD-10-CM

## 2017-12-10 DIAGNOSIS — Z8249 Family history of ischemic heart disease and other diseases of the circulatory system: Secondary | ICD-10-CM

## 2019-02-27 IMAGING — CT CT HEART SCORING
2 series · 16 of 20 positions shown, 18 images · non-contrast
Comparison: None.

CLINICAL DATA: Risk stratification

EXAM:
Coronary Calcium Score
TECHNIQUE: The patient was scanned on a Siemens Somatom 64 slice scanner. Axial
non-contrast 3 mm slices were carried out through the heart. The
data set was analyzed on a dedicated work station and scored using
the Agatson method.

[Series 2: casc 3.0 i36f 2 bestdiast 74 % · axial · 0.45mm/px · z∈[-271,-172]mm · 8 of 43 slices shown, 10 images]
[im 5/43  vessel]
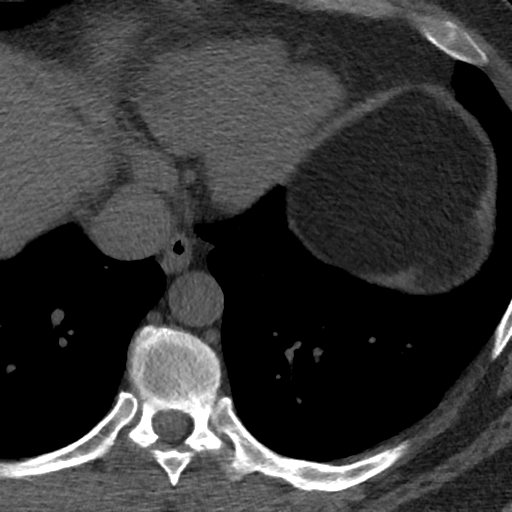
[im 5/43  lung]
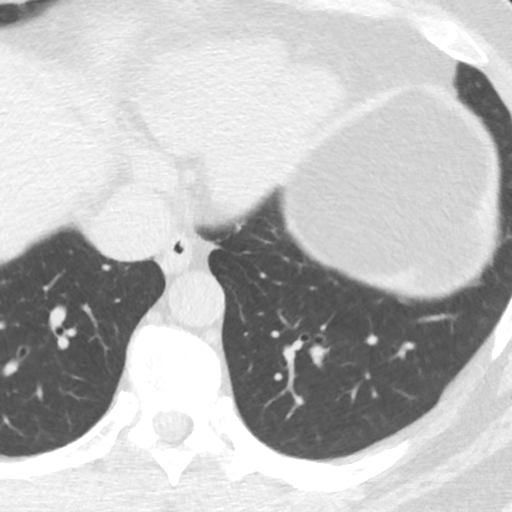
[im 10/43  vessel]
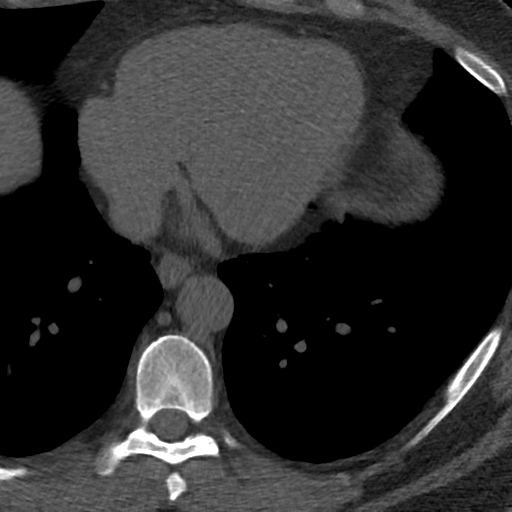
[im 15/43  vessel]
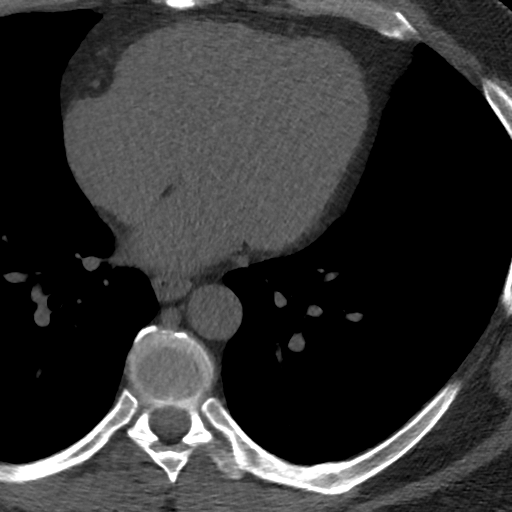
[im 19/43  vessel]
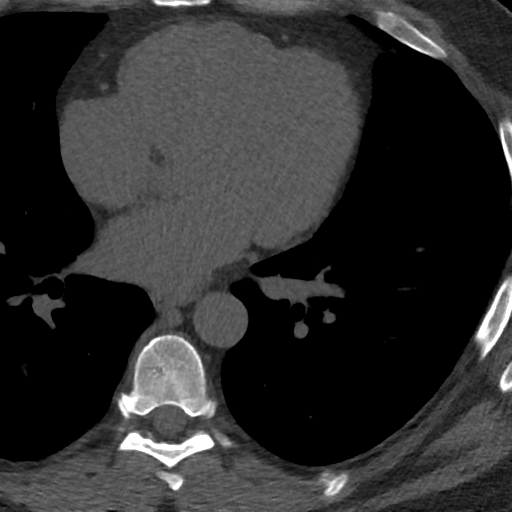
[im 24/43  vessel]
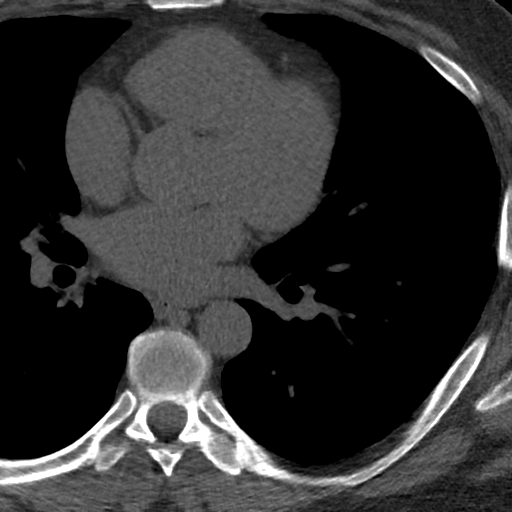
[im 24/43  lung]
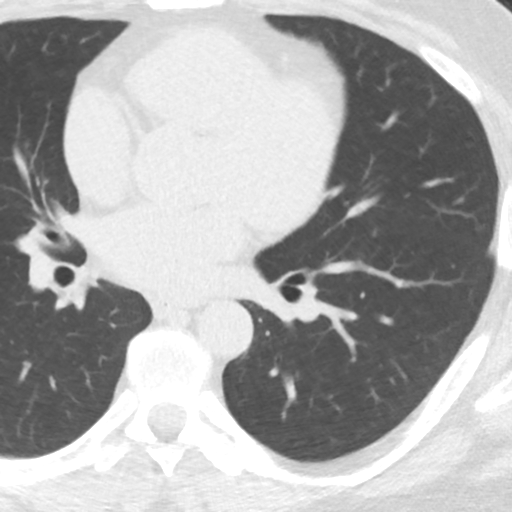
[im 29/43  vessel]
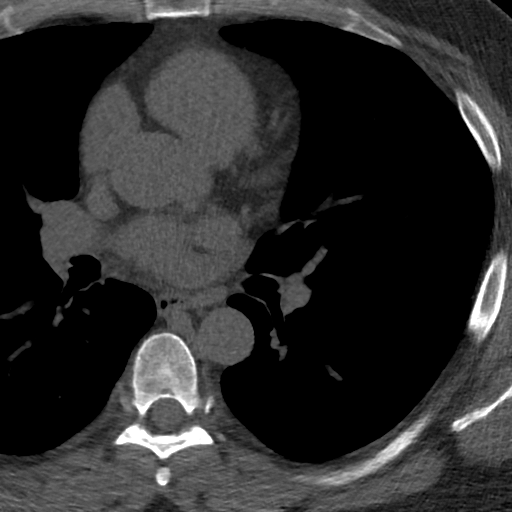
[im 33/43  vessel]
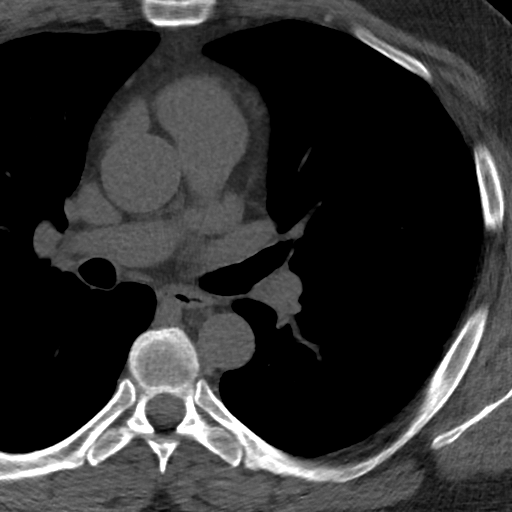
[im 38/43  vessel]
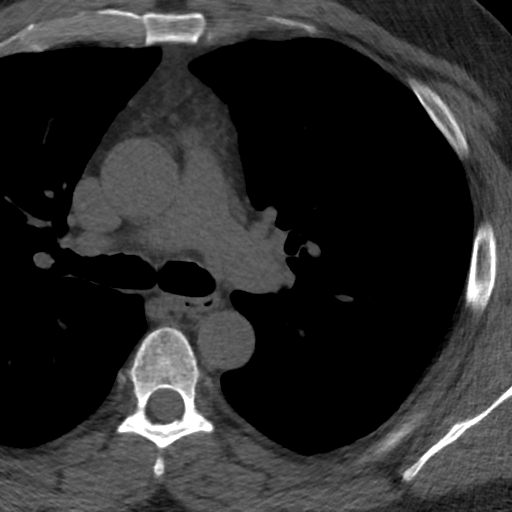

[Series 4: lung st 74 % · axial · 0.80mm/px · z∈[-274,-172]mm · 8 of 44 slices shown]
[im 5/44  lung]
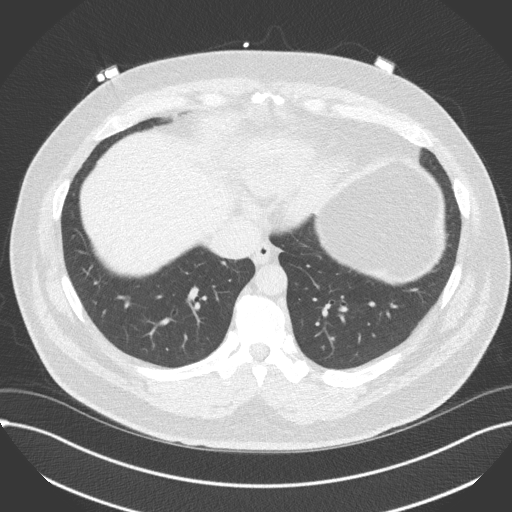
[im 10/44  lung]
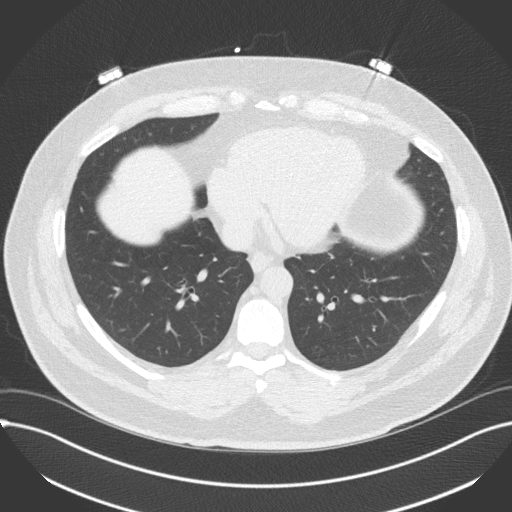
[im 15/44  lung]
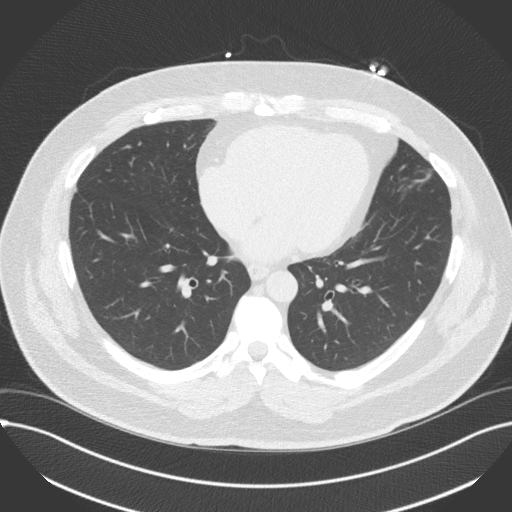
[im 20/44  lung]
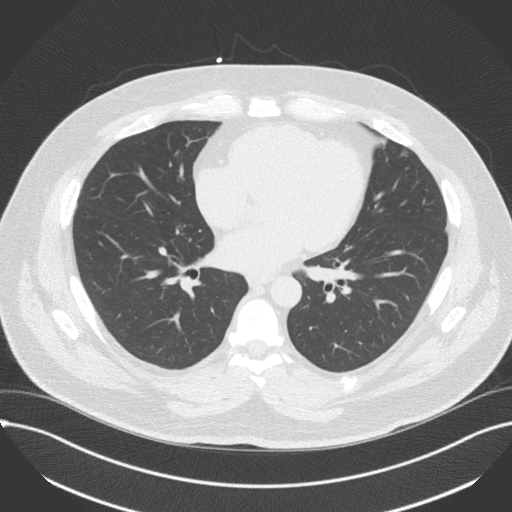
[im 24/44  lung]
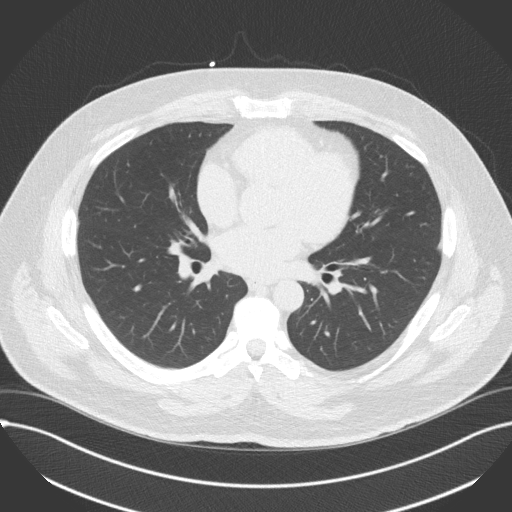
[im 29/44  lung]
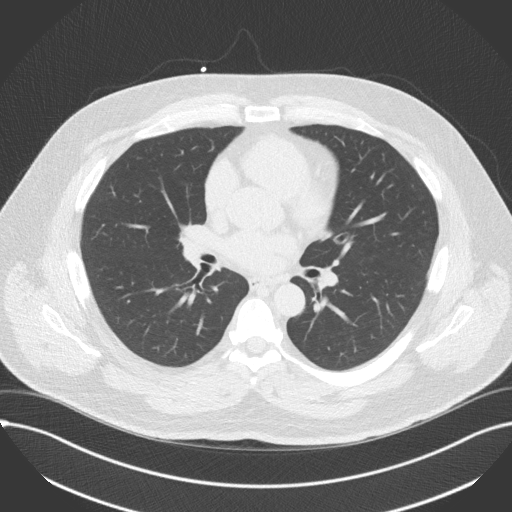
[im 34/44  lung]
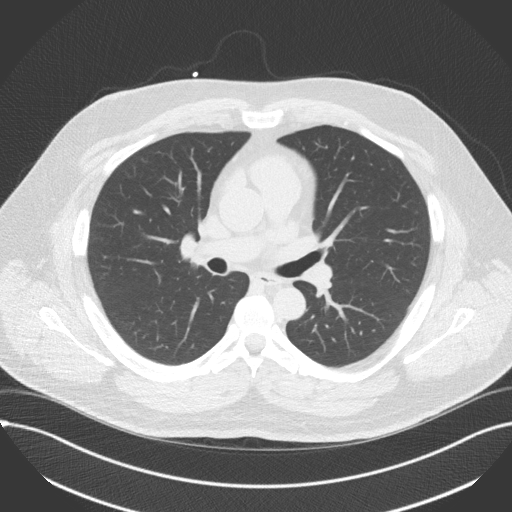
[im 39/44  lung]
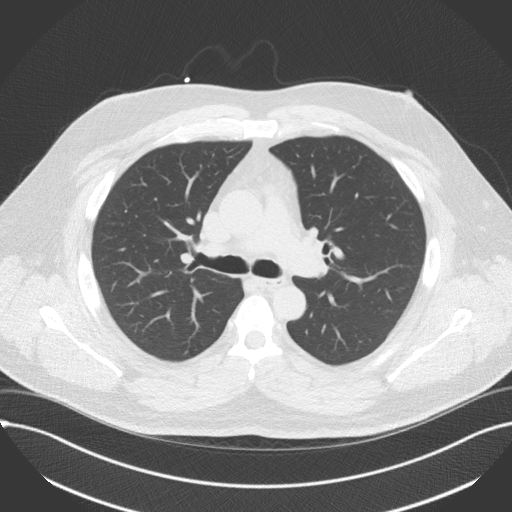

[16 of 20 positions shown; findings below may reference images not displayed]

FINDINGS: Non-cardiac: See separate report from [REDACTED].

Ascending Aorta: Mildly dilated at 3.8 cm

Pericardium: Normal

Coronary arteries: No calcium detected
IMPRESSION: Coronary calcium score of 0.

Jerimy Fulgencio

EXAM:
OVER-READ INTERPRETATION  CT CHEST

The following report is an over-read performed by radiologist Dr.
Ana-Darko Ambroz [REDACTED] on 12/10/2017. This over-read
does not include interpretation of cardiac or coronary anatomy or
pathology. The coronary calcium score interpretation by the
cardiologist is attached.
FINDINGS: Vascular: Heart is normal size.  Visualized aorta is normal caliber.

Mediastinum/Nodes: No adenopathy in the lower mediastinum or hila.

Lungs/Pleura: Linear scarring in the lingula. Visualized lungs
otherwise clear. No effusions.

Upper Abdomen: Imaging into the upper abdomen shows no acute
findings.

Musculoskeletal: Chest wall soft tissues are unremarkable. No acute
bony abnormality.
IMPRESSION: Lingular scarring.  No acute extra cardiac abnormality.

## 2019-03-16 DIAGNOSIS — Z125 Encounter for screening for malignant neoplasm of prostate: Secondary | ICD-10-CM | POA: Diagnosis not present

## 2019-03-16 DIAGNOSIS — Z0001 Encounter for general adult medical examination with abnormal findings: Secondary | ICD-10-CM | POA: Diagnosis not present

## 2019-03-16 DIAGNOSIS — Z6837 Body mass index (BMI) 37.0-37.9, adult: Secondary | ICD-10-CM | POA: Diagnosis not present

## 2019-03-22 ENCOUNTER — Encounter: Payer: Self-pay | Admitting: Internal Medicine

## 2019-04-24 ENCOUNTER — Ambulatory Visit (INDEPENDENT_AMBULATORY_CARE_PROVIDER_SITE_OTHER): Payer: Self-pay | Admitting: *Deleted

## 2019-04-24 ENCOUNTER — Other Ambulatory Visit: Payer: Self-pay

## 2019-04-24 DIAGNOSIS — Z1211 Encounter for screening for malignant neoplasm of colon: Secondary | ICD-10-CM

## 2019-04-24 MED ORDER — PEG 3350-KCL-NA BICARB-NACL 420 G PO SOLR: 4000.0000 mL | Freq: Once | ORAL | 0 refills | Status: AC

## 2019-04-24 NOTE — Patient Instructions (Signed)
Derek Villegas   03-19-1966 MRN: [id number]    Procedure Date: 07/19/2019 Time to register: 8:30 am Place to register: [facility] Procedure Time: 9:30 am Scheduled provider: Dr. Gala Romney  PREPARATION FOR COLONOSCOPY WITH TRI-LYTE SPLIT PREP  Please notify us immediately if you are diabetic, take iron supplements, or if you are on Coumadin or any other blood thinners.    You will need to purchase 1 fleet enema and 1 box of Bisacodyl '5mg'$  tablets.   2 DAYS BEFORE PROCEDURE:  DATE: 07/17/2019  DAY: Monday Begin clear liquid diet AFTER your lunch meal. NO SOLID FOODS after this point.  1 DAY BEFORE PROCEDURE:  DATE: 07/18/2019  DAY: Tuesday Continue clear liquids the entire day - NO SOLID FOOD.    At 2:00 pm:  Take 2 Bisacodyl tablets.   At 4:00pm:  Start drinking your solution. Make sure you mix well per instructions on the bottle. Try to drink 1 (one) 8 ounce glass every 10-15 minutes until you have consumed HALF the jug. You should complete by 6:00pm.You must keep the left over solution refrigerated until completed next day.  Continue clear liquids. You must drink plenty of clear liquids to prevent dehyration and kidney failure.     DAY OF PROCEDURE:   DATE: 07/19/2019   DAY: Wednesday If you take medications for your heart, blood pressure or breathing, you may take these medications.    Five hours before your procedure time @ 4:30 am:  Finish remaining amout of bowel prep, drinking 1 (one) 8 ounce glass every 10-15 minutes until complete. You have two hours to consume remaining prep.   Three hours before your procedure time @ 6:30 am:  Nothing by mouth.   At least one hour before going to the hospital:  Give yourself one Fleet enema. You may take your morning medications with sip of water unless we have instructed otherwise.      Please see below for Dietary Information.  CLEAR LIQUIDS INCLUDE:  Water Jello (NOT red in color)   Ice Popsicles (NOT red in color)   Tea  (sugar ok, no milk/cream) Powdered fruit flavored drinks  Coffee (sugar ok, no milk/cream) Gatorade/ Lemonade/ Kool-Aid  (NOT red in color)   Juice: apple, white grape, white cranberry Soft drinks  Clear bullion, consomme, broth (fat free beef/chicken/vegetable)  Carbonated beverages (any kind)  Strained chicken noodle soup Hard Candy   Remember: Clear liquids are liquids that will allow you to see your fingers on the other side of a clear glass. Be sure liquids are NOT red in color, and not cloudy, but CLEAR.  DO NOT EAT OR DRINK ANY OF THE FOLLOWING:  Dairy products of any kind   Cranberry juice Tomato juice / V8 juice   Grapefruit juice Orange juice     Red grape juice  Do not eat any solid foods, including such foods as: cereal, oatmeal, yogurt, fruits, vegetables, creamed soups, eggs, bread, crackers, pureed foods in a blender, etc.   HELPFUL HINTS FOR DRINKING PREP SOLUTION:   Make sure prep is extremely cold. Mix and refrigerate the the morning of the prep. You may also put in the freezer.   You may try mixing some Crystal Light or Country Time Lemonade if you prefer. Mix in small amounts; add more if necessary.  Try drinking through a straw  Rinse mouth with water or a mouthwash between glasses, to remove after-taste.  Try sipping on a cold beverage /ice/ popsicles between glasses  of prep.  Place a piece of sugar-free hard candy in mouth between glasses.  If you become nauseated, try consuming smaller amounts, or stretch out the time between glasses. Stop for 30-60 minutes, then slowly start back drinking.        OTHER INSTRUCTIONS  You will need a responsible adult at least 53 years of age to accompany you and drive you home. This person must remain in the waiting room during your procedure. The hospital will cancel your procedure if you do not have a responsible adult with you.   1. Wear loose fitting clothing that is easily removed. 2. Leave jewelry and other  valuables at home.  3. Remove all body piercing jewelry and leave at home. 4. Total time from sign-in until discharge is approximately 2-3 hours. 5. You should go home directly after your procedure and rest. You can resume normal activities the day after your procedure. 6. The day of your procedure you should not:  Drive  Make legal decisions  Operate machinery  Drink alcohol  Return to work   You may call the office (Dept: [phone]) before 5:00pm, or page the doctor on call [phone]) after 5:00pm, for further instructions, if necessary.   Insurance Information YOU WILL NEED TO CHECK WITH YOUR INSURANCE COMPANY FOR THE BENEFITS OF COVERAGE YOU HAVE FOR THIS PROCEDURE.  UNFORTUNATELY, NOT ALL INSURANCE COMPANIES HAVE BENEFITS TO COVER ALL OR PART OF THESE TYPES OF PROCEDURES.  IT IS YOUR RESPONSIBILITY TO CHECK YOUR BENEFITS, HOWEVER, WE WILL BE GLAD TO ASSIST YOU WITH ANY CODES YOUR INSURANCE COMPANY MAY NEED.    PLEASE NOTE THAT MOST INSURANCE COMPANIES WILL NOT COVER A SCREENING COLONOSCOPY FOR PEOPLE UNDER THE AGE OF 50  IF YOU HAVE BCBS INSURANCE, YOU MAY HAVE BENEFITS FOR A SCREENING COLONOSCOPY BUT IF POLYPS ARE FOUND THE DIAGNOSIS WILL CHANGE AND THEN YOU MAY HAVE A DEDUCTIBLE THAT WILL NEED TO BE MET. SO PLEASE MAKE SURE YOU CHECK YOUR BENEFITS FOR A SCREENING COLONOSCOPY AS WELL AS A DIAGNOSTIC COLONOSCOPY.

## 2019-04-24 NOTE — Progress Notes (Signed)
Gastroenterology Pre-Procedure Review  Request Date: 04/24/2019 Requesting Physician: Dr. Gerarda Fraction, no previous TCS  PATIENT REVIEW QUESTIONS: The patient responded to the following health history questions as indicated:    1. Diabetes Melitis: no 2. Joint replacements in the past 12 months: no 3. Major health problems in the past 3 months: no 4. Has an artificial valve or MVP: no 5. Has a defibrillator: no 6. Has been advised in past to take antibiotics in advance of a procedure like teeth cleaning: no 7. Family history of colon cancer: no  8. Alcohol Use: yes, 3 beers a week 9. Illicit drug Use: no 10. History of sleep apnea: yes, CPAP  11. History of coronary artery or other vascular stents placed within the last 12 months: no 12. History of any prior anesthesia complications: no 13. There is no height or weight on file to calculate BMI.ht: 5'11 wt: 270 lbs    MEDICATIONS & ALLERGIES:    Patient reports the following regarding taking any blood thinners:   Plavix? no Aspirin? yes Coumadin? no Brilinta? no Xarelto? no Eliquis? no Pradaxa? no Savaysa? no Effient? no  Patient confirms/reports the following medications:  Current Outpatient Medications  Medication Sig Dispense Refill  . aspirin EC 81 MG tablet Take 81 mg by mouth daily.    . citalopram (CELEXA) 40 MG tablet Take 40 mg by mouth daily.    Marland Kitchen co-enzyme Q-10 30 MG capsule Take 30 mg by mouth daily.    . Omega-3 Fatty Acids (FISH OIL) 1000 MG CAPS Take 1,000 mg by mouth daily.    . pravastatin (PRAVACHOL) 20 MG tablet Take 40 mg by mouth daily.      No current facility-administered medications for this visit.     Patient confirms/reports the following allergies:  No Known Allergies  No orders of the defined types were placed in this encounter.   AUTHORIZATION INFORMATION Primary Insurance: [place],  [place] #: [id number],  Group #: [id number] Pre-Cert / Josem Kaufmann required: No, not required  SCHEDULE  INFORMATION: Procedure has been scheduled as follows:  Date: 07/19/2019, Time: 9:30 Location: [facility] with Dr.Rourk  This Gastroenterology Pre-Precedure Review Form is being routed to the following provider(s): Aliene Altes, PA

## 2019-04-25 NOTE — Progress Notes (Signed)
Ok to schedule. If patient knows his CPAP settings, he can bring that information with him.

## 2019-04-26 NOTE — Progress Notes (Signed)
Pt informed to bring CPAP settings with him on the day of his procedure.   Pt voiced understanding.

## 2019-04-26 NOTE — Addendum Note (Signed)
Addended by: Metro Kung on: 04/26/2019 07:45 AM   Modules accepted: Orders, SmartSet

## 2019-07-17 ENCOUNTER — Other Ambulatory Visit: Payer: Self-pay

## 2019-07-17 ENCOUNTER — Other Ambulatory Visit (HOSPITAL_COMMUNITY)
Admission: RE | Admit: 2019-07-17 | Discharge: 2019-07-17 | Disposition: A | Discharge status: Home / Self Care | Payer: BC Managed Care – PPO | Source: Ambulatory Visit | Attending: Internal Medicine | Admitting: Internal Medicine

## 2019-07-18 ENCOUNTER — Telehealth: Payer: Self-pay

## 2019-07-18 NOTE — Telephone Encounter (Signed)
Derek Villegas at endo called office, pt was no show for COVID test yesterday. TCS is scheduled for 07/19/19. Tried to call pt, no answer, unable to leave message d/t voicemail box is full.

## 2019-07-18 NOTE — Telephone Encounter (Signed)
Pt called back and said that he wanted to cancel his procedure.  He said that he has a cold.  He said it would be hard to reschedule right now due to work.  Called Kim in Endo and notified her.

## 2019-07-19 ENCOUNTER — Encounter (HOSPITAL_COMMUNITY): Admission: RE | Payer: Self-pay | Source: Home / Self Care

## 2019-07-19 ENCOUNTER — Ambulatory Visit (HOSPITAL_COMMUNITY)
Admission: RE | Admit: 2019-07-19 | Payer: BC Managed Care – PPO | Source: Home / Self Care | Admitting: Internal Medicine

## 2019-07-19 SURGERY — COLONOSCOPY
Anesthesia: Moderate Sedation

## 2019-09-22 ENCOUNTER — Observation Stay (HOSPITAL_COMMUNITY)
Admission: EM | Admit: 2019-09-22 | Discharge: 2019-09-23 | Disposition: A | Discharge status: Home / Self Care | Payer: BC Managed Care – PPO | Attending: Internal Medicine | Admitting: Internal Medicine

## 2019-09-22 ENCOUNTER — Other Ambulatory Visit: Payer: Self-pay

## 2019-09-22 ENCOUNTER — Encounter (HOSPITAL_COMMUNITY): Payer: Self-pay | Admitting: Emergency Medicine

## 2019-09-22 ENCOUNTER — Emergency Department (HOSPITAL_COMMUNITY): Payer: BC Managed Care – PPO

## 2019-09-22 DIAGNOSIS — K859 Acute pancreatitis without necrosis or infection, unspecified: Secondary | ICD-10-CM | POA: Diagnosis not present

## 2019-09-22 DIAGNOSIS — Z03818 Encounter for observation for suspected exposure to other biological agents ruled out: Secondary | ICD-10-CM | POA: Diagnosis not present

## 2019-09-22 DIAGNOSIS — D72829 Elevated white blood cell count, unspecified: Secondary | ICD-10-CM | POA: Diagnosis not present

## 2019-09-22 DIAGNOSIS — K7689 Other specified diseases of liver: Secondary | ICD-10-CM

## 2019-09-22 DIAGNOSIS — Z7982 Long term (current) use of aspirin: Secondary | ICD-10-CM | POA: Insufficient documentation

## 2019-09-22 DIAGNOSIS — K769 Liver disease, unspecified: Secondary | ICD-10-CM | POA: Diagnosis not present

## 2019-09-22 DIAGNOSIS — G4733 Obstructive sleep apnea (adult) (pediatric): Secondary | ICD-10-CM | POA: Diagnosis not present

## 2019-09-22 DIAGNOSIS — E66812 Obesity, class 2: Secondary | ICD-10-CM | POA: Diagnosis present

## 2019-09-22 DIAGNOSIS — F419 Anxiety disorder, unspecified: Secondary | ICD-10-CM | POA: Diagnosis present

## 2019-09-22 DIAGNOSIS — E785 Hyperlipidemia, unspecified: Secondary | ICD-10-CM | POA: Diagnosis present

## 2019-09-22 DIAGNOSIS — E669 Obesity, unspecified: Secondary | ICD-10-CM | POA: Diagnosis present

## 2019-09-22 DIAGNOSIS — R109 Unspecified abdominal pain: Secondary | ICD-10-CM | POA: Diagnosis not present

## 2019-09-22 DIAGNOSIS — F17290 Nicotine dependence, other tobacco product, uncomplicated: Secondary | ICD-10-CM | POA: Insufficient documentation

## 2019-09-22 DIAGNOSIS — I861 Scrotal varices: Secondary | ICD-10-CM

## 2019-09-22 DIAGNOSIS — Z20822 Contact with and (suspected) exposure to covid-19: Secondary | ICD-10-CM | POA: Insufficient documentation

## 2019-09-22 LAB — LIPID PANEL
Cholesterol: 216 mg/dL — ABNORMAL HIGH (ref 0–200)
HDL: 52 mg/dL (ref >40)
LDL Cholesterol: 141 mg/dL — ABNORMAL HIGH (ref 0–99)
Total CHOL/HDL Ratio: 4.2 RATIO
Triglycerides: 116 mg/dL (ref <150)
VLDL: 23 mg/dL (ref 0–40)

## 2019-09-22 LAB — URINALYSIS, ROUTINE W REFLEX MICROSCOPIC
Bacteria, UA: NONE SEEN
Bilirubin Urine: NEGATIVE
Glucose, UA: NEGATIVE mg/dL
Ketones, ur: NEGATIVE mg/dL
Leukocytes,Ua: NEGATIVE
Nitrite: NEGATIVE
Protein, ur: NEGATIVE mg/dL
Specific Gravity, Urine: 1.015 (ref 1.005–1.030)
pH: 7 (ref 5.0–8.0)

## 2019-09-22 LAB — COMPREHENSIVE METABOLIC PANEL
ALT: 32 U/L (ref 0–44)
AST: 17 U/L (ref 15–41)
Albumin: 4 g/dL (ref 3.5–5.0)
Alkaline Phosphatase: 76 U/L (ref 38–126)
Anion gap: 9 (ref 5–15)
BUN: 13 mg/dL (ref 6–20)
CO2: 23 mmol/L (ref 22–32)
Calcium: 8.9 mg/dL (ref 8.9–10.3)
Chloride: 104 mmol/L (ref 98–111)
Creatinine, Ser: 0.85 mg/dL (ref 0.61–1.24)
GFR calc Af Amer: >60 mL/min (ref >60)
GFR calc non Af Amer: >60 mL/min (ref >60)
Glucose, Bld: 142 mg/dL — ABNORMAL HIGH (ref 70–99)
Potassium: 3.7 mmol/L (ref 3.5–5.1)
Sodium: 136 mmol/L (ref 135–145)
Total Bilirubin: 0.6 mg/dL (ref 0.3–1.2)
Total Protein: 7.5 g/dL (ref 6.5–8.1)

## 2019-09-22 LAB — MAGNESIUM: Magnesium: 2.1 mg/dL (ref 1.7–2.4)

## 2019-09-22 LAB — CBC
HCT: 43.1 % (ref 39.0–52.0)
Hemoglobin: 14.2 g/dL (ref 13.0–17.0)
MCH: 29.5 pg (ref 26.0–34.0)
MCHC: 32.9 g/dL (ref 30.0–36.0)
MCV: 89.4 fL (ref 80.0–100.0)
Platelets: 302 10*3/uL (ref 150–400)
RBC: 4.82 MIL/uL (ref 4.22–5.81)
RDW: 13.1 % (ref 11.5–15.5)
WBC: 21.1 10*3/uL — ABNORMAL HIGH (ref 4.0–10.5)
nRBC: 0 % (ref 0.0–0.2)

## 2019-09-22 LAB — PHOSPHORUS: Phosphorus: 2.7 mg/dL (ref 2.5–4.6)

## 2019-09-22 LAB — LIPASE, BLOOD: Lipase: 92 U/L — ABNORMAL HIGH (ref 11–51)

## 2019-09-22 MED ORDER — MORPHINE SULFATE (PF) 4 MG/ML IV SOLN
4.0000 mg | Freq: Once | INTRAVENOUS | Status: AC
  Administered 2019-09-22: 20:00:00 4 mg via INTRAVENOUS
  Filled 2019-09-22: qty 1

## 2019-09-22 MED ORDER — SODIUM CHLORIDE 0.9 % IV SOLN
1000.0000 mL | INTRAVENOUS | Status: DC
  Administered 2019-09-22: 1000 mL via INTRAVENOUS

## 2019-09-22 MED ORDER — IOHEXOL 300 MG/ML  SOLN
100.0000 mL | Freq: Once | INTRAMUSCULAR | Status: AC | PRN
  Administered 2019-09-22: 100 mL via INTRAVENOUS

## 2019-09-22 MED ORDER — ONDANSETRON HCL 4 MG PO TABS: 4.0000 mg | ORAL_TABLET | Freq: Four times a day (QID) | ORAL | Status: DC | PRN

## 2019-09-22 MED ORDER — PANTOPRAZOLE SODIUM 40 MG IV SOLR
40.0000 mg | Freq: Every day | INTRAVENOUS | Status: DC
  Administered 2019-09-22: 40 mg via INTRAVENOUS
  Filled 2019-09-22: qty 40

## 2019-09-22 MED ORDER — ACETAMINOPHEN 325 MG PO TABS
650.0000 mg | ORAL_TABLET | Freq: Four times a day (QID) | ORAL | Status: DC | PRN
  Administered 2019-09-23: 650 mg via ORAL
  Filled 2019-09-22: qty 2

## 2019-09-22 MED ORDER — POTASSIUM CHLORIDE IN NACL 20-0.9 MEQ/L-% IV SOLN
INTRAVENOUS | Status: DC
  Filled 2019-09-22: qty 1000

## 2019-09-22 MED ORDER — SODIUM CHLORIDE 0.9 % IV BOLUS (SEPSIS)
1000.0000 mL | Freq: Once | INTRAVENOUS | Status: AC
  Administered 2019-09-22: 1000 mL via INTRAVENOUS

## 2019-09-22 MED ORDER — ONDANSETRON HCL 4 MG/2ML IJ SOLN
4.0000 mg | Freq: Once | INTRAMUSCULAR | Status: AC
  Administered 2019-09-22: 20:00:00 4 mg via INTRAVENOUS
  Filled 2019-09-22: qty 2

## 2019-09-22 MED ORDER — ONDANSETRON HCL 4 MG/2ML IJ SOLN: 4.0000 mg | Freq: Four times a day (QID) | INTRAMUSCULAR | Status: DC | PRN

## 2019-09-22 MED ORDER — SODIUM CHLORIDE 0.9 % IV BOLUS
1000.0000 mL | Freq: Once | INTRAVENOUS | Status: AC
  Administered 2019-09-22: 1000 mL via INTRAVENOUS

## 2019-09-22 MED ORDER — ACETAMINOPHEN 650 MG RE SUPP: 650.0000 mg | Freq: Four times a day (QID) | RECTAL | Status: DC | PRN

## 2019-09-22 MED ORDER — MORPHINE SULFATE (PF) 4 MG/ML IV SOLN
4.0000 mg | INTRAVENOUS | Status: DC | PRN
  Administered 2019-09-22 – 2019-09-23 (×2): 4 mg via INTRAVENOUS
  Filled 2019-09-22 (×2): qty 1

## 2019-09-22 NOTE — ED Provider Notes (Signed)
[facility] EMERGENCY DEPARTMENT Provider Note   CSN: [id number] Arrival date & time: 09/22/19  1934     History Chief Complaint  Patient presents with  . Abdominal Pain    MD SMOLA is a 54 y.o. male.  HPI   Patient presents ED for evaluation of abdominal pain.  Patient states he started having pain in his abdomen and a band across his mid abdomen that started this morning.  She states throughout the day the pain has been progressively getting worse.  He has not taken anything for the pain.  He denies any nausea vomiting or diarrhea.  Nothing specifically makes it better or worse.  Patient denies having symptoms like this in the past.  He was concerned about the degree of pain and decided come to the ED for evaluation.  Past Medical History:  Diagnosis Date  . Hyperlipemia   . Obesity, morbid ([place])     There are no problems to display for this patient.   Past Surgical History:  Procedure Laterality Date  . TONSILLECTOMY AND ADENOIDECTOMY         Family History  Problem Relation Age of Onset  . Kidney failure Mother   . Hypertension Mother   . Diabetes Mother   . Hypertension Father   . Diabetes Father   . Hypertension Brother   . Diabetes Brother     Social History   Tobacco Use  . Smoking status: Former Smoker    Quit date: 06/29/1987    Years since quitting: 32.2  . Smokeless tobacco: Current User  Substance Use Topics  . Alcohol use: No    Alcohol/week: 0.0 standard drinks  . Drug use: No    Home Medications Prior to Admission medications   Medication Sig Start Date End Date Taking? Authorizing Provider  aspirin EC 81 MG tablet Take 81 mg by mouth daily.   Yes [provider]  cholecalciferol (VITAMIN D3) 25 MCG (1000 UNIT) tablet Take 1,000 Units by mouth daily.   Yes [provider]  citalopram (CELEXA) 40 MG tablet Take 40 mg by mouth daily.   Yes [provider]  co-enzyme Q-10 30 MG capsule Take 30 mg by mouth  daily.   Yes [provider]  Cyanocobalamin (B-12 PO) Take 1 tablet by mouth daily.   Yes [provider]  Omega-3 Fatty Acids (FISH OIL) 1000 MG CAPS Take 1,000 mg by mouth daily.   Yes [provider]  pravastatin (PRAVACHOL) 40 MG tablet Take 40 mg by mouth daily. 09/21/19  Yes [provider]    Allergies    Patient has no known allergies.  Review of Systems   Review of Systems  All other systems reviewed and are negative.   Physical Exam Updated Vital Signs BP (!) 141/118 (BP Location: Right Arm)   Pulse 86   Temp 98.4 F (36.9 C) (Oral)   Resp 17   Ht 1.803 m (5\' 11" )   Wt 121 kg   SpO2 97%   BMI 37.21 kg/m   Physical Exam Vitals and nursing note reviewed.  Constitutional:      General: He is not in acute distress.    Appearance: He is well-developed.  HENT:     Head: Normocephalic and atraumatic.     Right Ear: External ear normal.     Left Ear: External ear normal.  Eyes:     General: No scleral icterus.       Right eye: No discharge.  Left eye: No discharge.     Conjunctiva/sclera: Conjunctivae normal.  Neck:     Trachea: No tracheal deviation.  Cardiovascular:     Rate and Rhythm: Normal rate and regular rhythm.  Pulmonary:     Effort: Pulmonary effort is normal. No respiratory distress.     Breath sounds: Normal breath sounds. No stridor. No wheezing or rales.  Abdominal:     General: Bowel sounds are normal. There is no distension.     Palpations: Abdomen is soft.     Tenderness: There is generalized abdominal tenderness. There is no guarding or rebound.  Musculoskeletal:        General: No tenderness.     Cervical back: Neck supple.  Skin:    General: Skin is warm and dry.     Findings: No rash.  Neurological:     Mental Status: He is alert.     Cranial Nerves: No cranial nerve deficit (no facial droop, extraocular movements intact, no slurred speech).     Sensory: No sensory deficit.     Motor: No  abnormal muscle tone or seizure activity.     Coordination: Coordination normal.     ED Results / Procedures / Treatments   Labs (all labs ordered are listed, but only abnormal results are displayed) Labs Reviewed  LIPASE, BLOOD - Abnormal; Notable for the following components:      Result Value   Lipase 92 (*)    All other components within normal limits  COMPREHENSIVE METABOLIC PANEL - Abnormal; Notable for the following components:   Glucose, Bld 142 (*)    All other components within normal limits  CBC - Abnormal; Notable for the following components:   WBC 21.1 (*)    All other components within normal limits  URINALYSIS, ROUTINE W REFLEX MICROSCOPIC - Abnormal; Notable for the following components:   Hgb urine dipstick SMALL (*)    All other components within normal limits  LIPID PANEL    EKG None  Radiology CT ABDOMEN PELVIS W CONTRAST  Result Date: 09/22/2019 CLINICAL DATA:  Generalized abdominal pain began at 07:00 EXAM: CT ABDOMEN AND PELVIS WITH CONTRAST TECHNIQUE: Multidetector CT imaging of the abdomen and pelvis was performed using the standard protocol following bolus administration of intravenous contrast. CONTRAST:  OMNIPAQUE IOHEXOL 300 MG/ML  SOLN COMPARISON:  None. FINDINGS: Lower chest: Lung bases are clear. Normal heart size. No pericardial effusion. Hepatobiliary: 1.3 cm hypoattenuating focus along the left lobe liver margin (2/26) with some nodular discontinuous peripheral enhancement has an appearance most compatible with benign hepatic hemangioma. No focal concerning liver lesions. No biliary ductal dilatation. Normal gallbladder. No visible calcified gallstones or intraductal gallstones. Pancreas: There is partial fatty replacement of the pancreas. Edematous and inflammatory changes are present centered upon the pancreatic head and to a lesser extent uncinate process. No organized fluid collection or parenchymal hypoattenuation is evident to suggest  acute pancreatic necrosis. No ductal dilatation. Spleen: Normal in size without focal abnormality. Adrenals/Urinary Tract: Normal adrenal glands. Fluid attenuation 3.4 cm cyst seen in the interpolar left kidney. Additional 2 cm fluid attenuation cyst seen in the lower pole right kidney. Few smaller subcentimeter hypoattenuating foci in both kidney too small to fully characterize on CT imaging but statistically likely benign. Mild bilateral symmetric perinephric stranding, a nonspecific finding though may correlate with either age or decreased renal function. Kidneys are otherwise unremarkable, without renal calculi, suspicious lesion, or hydronephrosis. Bladder is unremarkable. Kidneys enhance and excrete symmetrically. Stomach/Bowel: Distal  esophagus and stomach are unremarkable. Some mild thickening of the second and third portion of the duodenal sweep may be reactive to the adjacent pancreatic inflammation detailed above. More distal duodenum and small bowel are free of mural thickening or dilatation. Cecum is slightly mobile, displaced superiorly. A normal appendix is visualized. Proximal colon is free of mural thickening or dilatation. Some mild low attenuation mural thickening of the distal colon may reflect intramural fat which can be secondary to body habitus or sequela of prior inflammatory bowel disease. Vascular/Lymphatic: The aorta is normal caliber. Reactive adenopathy in the upper abdomen and porta hepatis. No pathologically enlarged nodes in the abdomen or pelvis. Reproductive: Normal prostate and seminal vesicles. Possible varicoceles of the superior scrotum (2/114). Other: Bilateral fat containing inguinal hernias, left greater than right. Small fat containing umbilical hernia present as well. No bowel containing hernia. No abdominopelvic free fluid or air. Musculoskeletal: Multilevel degenerative changes are present in the imaged portions of the spine. No acute osseous abnormality or suspicious  osseous lesion. IMPRESSION: 1. Edematous and inflammatory changes centered upon the pancreatic head and to a lesser extent uncinate process, consistent with acute interstitial edematous pancreatitis. No organized fluid collection or parenchymal hypoattenuation is evident to suggest acute pancreatic necrosis. Recommend correlation with lipase. 2. Some mild thickening of the second and third portion of the duodenal sweep may be reactive to the adjacent pancreatic inflammation detailed above. 3. Bilateral fat containing inguinal hernias, left greater than right. Small fat containing umbilical hernia. 4. Possible varicoceles of the superior scrotum. Correlate with symptomatology and clinical exam with scrotal ultrasound, if clinically warranted, on a nonemergent, outpatient basis. 5. Some mild low attenuation mural thickening of the distal colon may reflect intramural fat which can be secondary to body habitus or sequela of prior inflammatory bowel disease. Correlate with patient history. 6. 1.3 cm hypoattenuating focus along the left lobe liver margin with some nodular discontinuous peripheral enhancement has an appearance most compatible with benign hepatic hemangioma. In the absence of risk factors, no further imaging follow-up is warranted. Otherwise, outpatient MRI with contrast could further evaluate. This recommendation follows ACR consensus guidelines: Management of Incidental Liver Lesions on CT: A White Paper of the ACR Incidental Findings Committee. J Am Coll Radiol 2017; 67:3419-3790. Electronically Signed   By: Kreg Shropshire M.D.   On: 09/22/2019 22:18    Procedures Procedures (including critical care time)  Medications Ordered in ED Medications  sodium chloride 0.9 % bolus 1,000 mL (0 mLs Intravenous Stopped 09/22/19 2041)    Followed by  0.9 %  sodium chloride infusion (1,000 mLs Intravenous New Bag/Given 09/22/19 2014)  ondansetron (ZOFRAN) injection 4 mg (4 mg Intravenous Given 09/22/19 2007)    morphine 4 MG/ML injection 4 mg (4 mg Intravenous Given 09/22/19 2010)  iohexol (OMNIPAQUE) 300 MG/ML solution 100 mL (100 mLs Intravenous Contrast Given 09/22/19 2156)    ED Course  I have reviewed the triage vital signs and the nursing notes.  Pertinent labs & imaging results that were available during my care of the patient were reviewed by me and considered in my medical decision making (see chart for details).    MDM Rules/Calculators/A&P                      Patient's labs are notable for an elevated white blood cell count.  Lipase also elevated at 92.  Patient was having persistent pain so a CT scan was performed.  CT scan reveals findings consistent  with acute pancreatitis.  Incidental liver nodule noted.  Etiology of this is unclear.  Patient denies any alcohol use.  I will add on a lipid panel.  Considering his pain I will consult the medical service for admission and further treatment. Final Clinical Impression(s) / ED Diagnoses Final diagnoses:  Acute pancreatitis, unspecified complication status, unspecified pancreatitis type  Liver nodule      Linwood Dibbles, MD 09/22/19 2326

## 2019-09-22 NOTE — H&P (Signed)
History and Physical    Derek Villegas:[id number] DOB: September 26, 1965 DOA: 09/22/2019  PCP: [name], MD   Patient coming from: Home.  I have personally briefly reviewed patient's old medical records in [facility]  Chief Complaint: Abdominal pain.  HPI: Derek Villegas is a 54 y.o. male with medical history significant of hyperlipidemia, morbid obesity, anxiety, OSA who is coming to the emergency department with complaints of abdominal pain since 0700, but state that when he woke up around 5 to 6 AM he was having some abdominal discomfort already.  He ate at [facility] burger with fries the previous evening.  He denies nausea, vomiting, diarrhea, constipation, melena or hematochezia.  No dysuria, frequency or hematuria.  He denies fever, rhinorrhea, sore throat, dyspnea, wheezing or hemoptysis.  No chest pain, palpitations, dizziness, diaphoresis, PND, orthopnea or pitting edema of the lower extremities.  No polyuria, polydipsia, polyphagia or blurred vision.  ED Course: Initial vital signs temperature 98.4 F, pulse 86, respirations 17, blood pressure 141/118 mmHg and O2 sat 97% on room air.  The patient received morphine 4 mg IVP, ondansetron 4 mg IVP and 1000 mL NS bolus.  Urinalysis shows small hemoglobinuria, but all other values are within normal range.  His white count was 21.1, hemoglobin 14.2 g/dL and platelets 315.  CMP shows glucose of 142 mg/dL, but all other values are within normal range.  Lipase was 92 units/L.  CT abdomen/pelvis consistent with acute pancreatitis.  Please see images and full radiology report for further detail.  Review of Systems: As per HPI otherwise all other systems reviewed and are negative.  Past Medical History:  Diagnosis Date  . Hyperlipemia   . Obesity, morbid [facility])     Past Surgical History:  Procedure Laterality Date  . TONSILLECTOMY AND ADENOIDECTOMY      Social History  reports that he quit smoking about 32 years ago. He uses  smokeless tobacco. He reports that he does not drink alcohol or use drugs.  No Known Allergies  Family History  Problem Relation Age of Onset  . Kidney failure Mother   . Hypertension Mother   . Diabetes Mother   . Hypertension Father   . Diabetes Father   . Hypertension Brother   . Diabetes Brother    Prior to Admission medications   Medication Sig Start Date End Date Taking? Authorizing Provider  aspirin EC 81 MG tablet Take 81 mg by mouth daily.   Yes [provider]  cholecalciferol (VITAMIN D3) 25 MCG (1000 UNIT) tablet Take 1,000 Units by mouth daily.   Yes [provider]  citalopram (CELEXA) 40 MG tablet Take 40 mg by mouth daily.   Yes [provider]  co-enzyme Q-10 30 MG capsule Take 30 mg by mouth daily.   Yes [provider]  Cyanocobalamin (B-12 PO) Take 1 tablet by mouth daily.   Yes [provider]  Omega-3 Fatty Acids (FISH OIL) 1000 MG CAPS Take 1,000 mg by mouth daily.   Yes [provider]  pravastatin (PRAVACHOL) 40 MG tablet Take 40 mg by mouth daily. 09/21/19  Yes [provider]    Physical Exam: Vitals:   09/22/19 1942  BP: (!) 141/118  Pulse: 86  Resp: 17  Temp: 98.4 F (36.9 C)  TempSrc: Oral  SpO2: 97%  Weight: 121 kg  Height: 5\' 11"  (1.803 m)    Constitutional: NAD, calm, comfortable Eyes: PERRL, lids and conjunctivae normal ENMT: Mucous membranes are moist. Posterior  pharynx clear of any exudate or lesions. Neck: normal, supple, no masses, no thyromegaly Respiratory: clear to auscultation bilaterally, no wheezing, no crackles. Normal respiratory effort. No accessory muscle use.  Cardiovascular: Regular rate and rhythm, no murmurs / rubs / gallops. No extremity edema. 2+ pedal pulses. No carotid bruits.  Abdomen: Obese, nondistended.  BS positive.  Soft, mild epigastric tenderness, no guarding or rebound, no masses palpated.  Delete Musculoskeletal: no clubbing / cyanosis.  Good  ROM, no contractures. Normal muscle tone.  Skin: no rashes, lesions, ulcers. No induration Neurologic: CN 2-12 grossly intact. Sensation intact, DTR normal. Strength 5/5 in all 4.  Psychiatric: Normal judgment and insight. Alert and oriented x 3. Normal mood.   Labs on Admission: I have personally reviewed following labs and imaging studies  CBC: Recent Labs  Lab 09/22/19 1955  WBC 21.1*  HGB 14.2  HCT 43.1  MCV 89.4  PLT 332    Basic Metabolic Panel: Recent Labs  Lab 09/22/19 1955  NA 136  K 3.7  CL 104  CO2 23  GLUCOSE 142*  BUN 13  CREATININE 0.85  CALCIUM 8.9    GFR: Estimated Creatinine Clearance: 131.5 mL/min (by C-G formula based on SCr of 0.85 mg/dL).  Liver Function Tests: Recent Labs  Lab 09/22/19 1955  AST 17  ALT 32  ALKPHOS 76  BILITOT 0.6  PROT 7.5  ALBUMIN 4.0    Urine analysis:    Component Value Date/Time   COLORURINE YELLOW 09/22/2019 2200   APPEARANCEUR CLEAR 09/22/2019 2200   LABSPEC 1.015 09/22/2019 2200   PHURINE 7.0 09/22/2019 2200   GLUCOSEU NEGATIVE 09/22/2019 2200   HGBUR SMALL (A) 09/22/2019 2200   BILIRUBINUR NEGATIVE 09/22/2019 2200   KETONESUR NEGATIVE 09/22/2019 2200   PROTEINUR NEGATIVE 09/22/2019 2200   NITRITE NEGATIVE 09/22/2019 2200   LEUKOCYTESUR NEGATIVE 09/22/2019 2200    Radiological Exams on Admission: CT ABDOMEN PELVIS W CONTRAST  Result Date: 09/22/2019 CLINICAL DATA:  Generalized abdominal pain began at 07:00 EXAM: CT ABDOMEN AND PELVIS WITH CONTRAST TECHNIQUE: Multidetector CT imaging of the abdomen and pelvis was performed using the standard protocol following bolus administration of intravenous contrast. CONTRAST:  127mL OMNIPAQUE IOHEXOL 300 MG/ML  SOLN COMPARISON:  None. FINDINGS: Lower chest: Lung bases are clear. Normal heart size. No pericardial effusion. Hepatobiliary: 1.3 cm hypoattenuating focus along the left lobe liver margin (2/26) with some nodular discontinuous peripheral enhancement has an  appearance most compatible with benign hepatic hemangioma. No focal concerning liver lesions. No biliary ductal dilatation. Normal gallbladder. No visible calcified gallstones or intraductal gallstones. Pancreas: There is partial fatty replacement of the pancreas. Edematous and inflammatory changes are present centered upon the pancreatic head and to a lesser extent uncinate process. No organized fluid collection or parenchymal hypoattenuation is evident to suggest acute pancreatic necrosis. No ductal dilatation. Spleen: Normal in size without focal abnormality. Adrenals/Urinary Tract: Normal adrenal glands. Fluid attenuation 3.4 cm cyst seen in the interpolar left kidney. Additional 2 cm fluid attenuation cyst seen in the lower pole right kidney. Few smaller subcentimeter hypoattenuating foci in both kidney too small to fully characterize on CT imaging but statistically likely benign. Mild bilateral symmetric perinephric stranding, a nonspecific finding though may correlate with either age or decreased renal function. Kidneys are otherwise unremarkable, without renal calculi, suspicious lesion, or hydronephrosis. Bladder is unremarkable. Kidneys enhance and excrete symmetrically. Stomach/Bowel: Distal esophagus and stomach are unremarkable. Some mild thickening of the second and third portion of the duodenal sweep may  be reactive to the adjacent pancreatic inflammation detailed above. More distal duodenum and small bowel are free of mural thickening or dilatation. Cecum is slightly mobile, displaced superiorly. A normal appendix is visualized. Proximal colon is free of mural thickening or dilatation. Some mild low attenuation mural thickening of the distal colon may reflect intramural fat which can be secondary to body habitus or sequela of prior inflammatory bowel disease. Vascular/Lymphatic: The aorta is normal caliber. Reactive adenopathy in the upper abdomen and porta hepatis. No pathologically enlarged nodes  in the abdomen or pelvis. Reproductive: Normal prostate and seminal vesicles. Possible varicoceles of the superior scrotum (2/114). Other: Bilateral fat containing inguinal hernias, left greater than right. Small fat containing umbilical hernia present as well. No bowel containing hernia. No abdominopelvic free fluid or air. Musculoskeletal: Multilevel degenerative changes are present in the imaged portions of the spine. No acute osseous abnormality or suspicious osseous lesion. IMPRESSION: 1. Edematous and inflammatory changes centered upon the pancreatic head and to a lesser extent uncinate process, consistent with acute interstitial edematous pancreatitis. No organized fluid collection or parenchymal hypoattenuation is evident to suggest acute pancreatic necrosis. Recommend correlation with lipase. 2. Some mild thickening of the second and third portion of the duodenal sweep may be reactive to the adjacent pancreatic inflammation detailed above. 3. Bilateral fat containing inguinal hernias, left greater than right. Small fat containing umbilical hernia. 4. Possible varicoceles of the superior scrotum. Correlate with symptomatology and clinical exam with scrotal ultrasound, if clinically warranted, on a nonemergent, outpatient basis. 5. Some mild low attenuation mural thickening of the distal colon may reflect intramural fat which can be secondary to body habitus or sequela of prior inflammatory bowel disease. Correlate with patient history. 6. 1.3 cm hypoattenuating focus along the left lobe liver margin with some nodular discontinuous peripheral enhancement has an appearance most compatible with benign hepatic hemangioma. In the absence of risk factors, no further imaging follow-up is warranted. Otherwise, outpatient MRI with contrast could further evaluate. This recommendation follows ACR consensus guidelines: Management of Incidental Liver Lesions on CT: A White Paper of the ACR Incidental Findings Committee.  J Am Coll Radiol 2017; 23:7628-3151. Electronically Signed   By: Kreg Shropshire M.D.   On: 09/22/2019 22:18    EKG: Independently reviewed.   Assessment/Plan Principal Problem:   Acute pancreatitis Observation/MedSurg. Keep n.p.o. Continue IV fluids Antiemetics as needed. Analgesics as needed. Protonix 40 mg IV every 24 hours. Follow-up CBC, CMP and lipase.  Active Problems:   Leukocytosis Secondary to pancreatitis. No fever or any other signs of infection. Follow-up WBC regularly.    Hyperlipemia Check fasting lipids. Hold statin for now.    Anxiety Continue citalopram 40 mg p.o. daily.    OSA (obstructive sleep apnea) Continue CPAP at bedtime.    Obesity, Class II, BMI 35-39.9 Continue lifestyle modifications.   DVT prophylaxis: SCDs. Code Status:   Full code. Family Communication:   Disposition Plan:   Patient is from:  Home.  Anticipated DC to:  Home.  Anticipated DC date:  09/23/2019 or 09/24/2019.  Anticipated DC barriers: Clinical improvement.  Consults called: Admission status:  Observation/MedSurg.  Severity of Illness: Moderate severity.   Bobette Mo MD [facility]  How to contact the [facility] Attending or Consulting provider 7A - 7P or covering provider during after hours 7P -7A, for this patient?   1. Check the care team in [facility] and look for a) attending/consulting [facility] provider listed and b) the [facility] team listed 2. Log  into [email or web address] and use [place]'s universal password to access. If you do not have the password, please contact the hospital operator. 3. Locate the [facility] provider you are looking for under [facility] and page to a number that you can be directly reached. 4. If you still have difficulty reaching the provider, please page the [facility] (Director on Call) for the Hospitalists listed on amion for assistance.  09/22/2019, 11:38 PM   This document was prepared using Dragon voice recognition software and may contain some  unintended transcription errors.

## 2019-09-22 NOTE — ED Triage Notes (Signed)
Pt C/O generalized abdominal pain that began at 0700 this morning. Denies N/V/D.

## 2019-09-23 DIAGNOSIS — K859 Acute pancreatitis without necrosis or infection, unspecified: Secondary | ICD-10-CM | POA: Diagnosis not present

## 2019-09-23 DIAGNOSIS — G4733 Obstructive sleep apnea (adult) (pediatric): Secondary | ICD-10-CM

## 2019-09-23 DIAGNOSIS — K7689 Other specified diseases of liver: Secondary | ICD-10-CM | POA: Diagnosis not present

## 2019-09-23 DIAGNOSIS — E785 Hyperlipidemia, unspecified: Secondary | ICD-10-CM

## 2019-09-23 DIAGNOSIS — F419 Anxiety disorder, unspecified: Secondary | ICD-10-CM | POA: Diagnosis not present

## 2019-09-23 DIAGNOSIS — E669 Obesity, unspecified: Secondary | ICD-10-CM

## 2019-09-23 DIAGNOSIS — I861 Scrotal varices: Secondary | ICD-10-CM

## 2019-09-23 LAB — LIPASE, BLOOD: Lipase: 49 U/L (ref 11–51)

## 2019-09-23 LAB — CBC WITH DIFFERENTIAL/PLATELET
Abs Immature Granulocytes: 0.1 10*3/uL — ABNORMAL HIGH (ref 0.00–0.07)
Basophils Absolute: 0 10*3/uL (ref 0.0–0.1)
Basophils Relative: 0 %
Eosinophils Absolute: 0.1 10*3/uL (ref 0.0–0.5)
Eosinophils Relative: 0 %
HCT: 40.5 % (ref 39.0–52.0)
Hemoglobin: 13.3 g/dL (ref 13.0–17.0)
Immature Granulocytes: 1 %
Lymphocytes Relative: 9 %
Lymphs Abs: 1.7 10*3/uL (ref 0.7–4.0)
MCH: 29.8 pg (ref 26.0–34.0)
MCHC: 32.8 g/dL (ref 30.0–36.0)
MCV: 90.6 fL (ref 80.0–100.0)
Monocytes Absolute: 1.3 10*3/uL — ABNORMAL HIGH (ref 0.1–1.0)
Monocytes Relative: 7 %
Neutro Abs: 15.8 10*3/uL — ABNORMAL HIGH (ref 1.7–7.7)
Neutrophils Relative %: 83 %
Platelets: 278 10*3/uL (ref 150–400)
RBC: 4.47 MIL/uL (ref 4.22–5.81)
RDW: 13.3 % (ref 11.5–15.5)
WBC: 19 10*3/uL — ABNORMAL HIGH (ref 4.0–10.5)
nRBC: 0 % (ref 0.0–0.2)

## 2019-09-23 LAB — COMPREHENSIVE METABOLIC PANEL
ALT: 25 U/L (ref 0–44)
AST: 13 U/L — ABNORMAL LOW (ref 15–41)
Albumin: 3.4 g/dL — ABNORMAL LOW (ref 3.5–5.0)
Alkaline Phosphatase: 65 U/L (ref 38–126)
Anion gap: 8 (ref 5–15)
BUN: 9 mg/dL (ref 6–20)
CO2: 24 mmol/L (ref 22–32)
Calcium: 8.3 mg/dL — ABNORMAL LOW (ref 8.9–10.3)
Chloride: 106 mmol/L (ref 98–111)
Creatinine, Ser: 0.81 mg/dL (ref 0.61–1.24)
GFR calc Af Amer: >60 mL/min (ref >60)
GFR calc non Af Amer: >60 mL/min (ref >60)
Glucose, Bld: 114 mg/dL — ABNORMAL HIGH (ref 70–99)
Potassium: 3.8 mmol/L (ref 3.5–5.1)
Sodium: 138 mmol/L (ref 135–145)
Total Bilirubin: 0.5 mg/dL (ref 0.3–1.2)
Total Protein: 6.7 g/dL (ref 6.5–8.1)

## 2019-09-23 LAB — SARS CORONAVIRUS 2 (TAT 6-24 HRS): SARS Coronavirus 2: NEGATIVE

## 2019-09-23 LAB — HIV ANTIBODY (ROUTINE TESTING W REFLEX): HIV Screen 4th Generation wRfx: NONREACTIVE

## 2019-09-23 MED ORDER — ACETAMINOPHEN 325 MG PO TABS: 650.0000 mg | ORAL_TABLET | Freq: Four times a day (QID) | ORAL | 0 refills | Status: AC | PRN

## 2019-09-23 MED ORDER — ONDANSETRON 8 MG PO TBDP: 8.0000 mg | ORAL_TABLET | Freq: Three times a day (TID) | ORAL | 0 refills | Status: AC | PRN

## 2019-09-23 MED ORDER — CITALOPRAM HYDROBROMIDE 20 MG PO TABS
40.0000 mg | ORAL_TABLET | Freq: Every day | ORAL | Status: DC
  Administered 2019-09-23: 09:00:00 40 mg via ORAL
  Filled 2019-09-23: qty 2

## 2019-09-23 MED ORDER — PANTOPRAZOLE SODIUM 20 MG PO TBEC: 20.0000 mg | DELAYED_RELEASE_TABLET | Freq: Every day | ORAL | 1 refills | Status: AC

## 2019-09-23 NOTE — Plan of Care (Signed)
  Problem: Education: Goal: Knowledge of General Education information will improve Description: Including pain rating scale, medication(s)/side effects and non-pharmacologic comfort measures Outcome: Progressing   Problem: Health Behavior/Discharge Planning: Goal: Ability to manage health-related needs will improve Outcome: Progressing   Problem: Clinical Measurements: Goal: Will remain free from infection Outcome: Progressing   

## 2019-09-23 NOTE — Discharge Summary (Signed)
Physician Discharge Summary  Derek Villegas ALP:[id number] DOB: [birth year] DOA: 09/22/2019  PCP: Derek Nevins, MD  Admit date: 09/22/2019 Discharge date: 09/23/2019  Time spent: 35 minutes  Recommendations for Outpatient Follow-up:  1. Repeat complete metabolic panel to follow electrolytes, renal function and LFTs 2. Repeat CBC to assure complete resolution of leukocytosis 3. As mentioned below patient with some incidental liver lesions and bilateral varicocele; if needed pursued recommended liver MRI and a scrotal ultrasound as an outpatient (patient completely asymptomatic and without any red flags)   Discharge Diagnoses:  Principal Problem:   Acute pancreatitis Active Problems:   Obesity, Class II, BMI 35-39.9   Hyperlipemia   Anxiety   OSA (obstructive sleep apnea)   Leukocytosis   Liver lesion   Varicocele   Discharge Condition: Stable and improved.  Discharged home with instruction to follow-up with PCP in 10 days.  CODE STATUS: Full code.  Diet recommendation: Heart healthy/low calorie diet.  Filed Weights   09/22/19 1942 09/23/19 0058  Weight: 121 kg 131.2 kg    History of present illness:  As per H&P written by Dr. Robb Villegas on 09/22/2019 54 y.o. male with medical history significant of hyperlipidemia, morbid obesity, anxiety, OSA who is coming to the emergency department with complaints of abdominal pain since 0700, but state that when he woke up around 5 to 6 AM he was having some abdominal discomfort already.  He ate at [facility] burger with fries the previous evening.  He denies nausea, vomiting, diarrhea, constipation, melena or hematochezia.  No dysuria, frequency or hematuria.  He denies fever, rhinorrhea, sore throat, dyspnea, wheezing or hemoptysis.  No chest pain, palpitations, dizziness, diaphoresis, PND, orthopnea or pitting edema of the lower extremities.  No polyuria, polydipsia, polyphagia or blurred vision.  ED Course: Initial vital signs temperature 98.4  F, pulse 86, respirations 17, blood pressure 141/118 mmHg and O2 sat 97% on room air.  The patient received morphine 4 mg IVP, ondansetron 4 mg IVP and 1000 mL NS bolus.  Urinalysis shows small hemoglobinuria, but all other values are within normal range.  His white count was 21.1, hemoglobin 14.2 g/dL and platelets 353.  CMP shows glucose of 142 mg/dL, but all other values are within normal range.  Lipase was 92 units/L.  CT abdomen/pelvis consistent with acute pancreatitis.  Please see images and full radiology report for further detail.  Hospital Course:  Idiopathic acute pancreatitis: No necrosis or pseudocyst appreciated. -Drastically improved with conservative management -Tolerating diet, no nausea, no vomiting, no significant abdominal discomfort at time of discharge.  Able to maintain adequate hydration orally. -Patient discharged home with instruction to follow-up with PCP in 10 days -He was encouraged to follow heart healthy/low-fat diet and received prescription for Tylenol and Zofran to assist with pain and any possible nausea symptoms while fully recovering. -Lipase within normal limits at discharge.  Leukocytosis -Secondary to pancreatitis and a stress demargination -No fever, no pseudocyst on the images or other source of infection -Already significantly improved after fluid resuscitation -Repeat CBC at follow-up visit to assure complete resolution of leukocytosis.  Hyperlipidemia -Statins -Patient advised to follow heart healthy diet.  Morbid obesity/obstructive sleep apnea -Body mass index is 40.34 kg/m. -Low calorie diet, portion control and increase physical activity discussed with patient -Continue CPAP nightly.  Anxiety/depression -Stable mood -No suicidal ideation or hallucination -Continue the use of Celexa.  Liver lesion (images most likely benign hemangioma) -Stable LFTs -No red flags reported by patient -Recommendations given for outpatient liver  MRI if  further work-up needed.  Bilateral varicocele -No complaints, scrotal swelling or suggestion for incarceration. -Outpatient scrotal ultrasound recommended.  Procedures:  See below for x-ray reports.  Consultations:  None  Discharge Exam: Vitals:   09/23/19 0914 09/23/19 1251  BP: 124/73 114/79  Pulse: 83 76  Resp: 18 16  Temp: 98.4 F (36.9 C) 98.3 F (36.8 C)  SpO2: 91% 93%    General: Afebrile, no chest pain, no nausea, no vomiting, no significant abdominal discomfort.  Able to tolerate diet and maintain oral hydration prior to discharge. Cardiovascular: S1 and S2, no rubs, no gallops, unable to properly assess JVD with body habitus. Respiratory: Clear to auscultation bilaterally; normal respiratory effort.  Good oxygen saturation on room air. Abdomen: Soft, no guarding, positive bowel sounds, very little mid epigastric area discomfort on deep palpation. Extremities: Trace edema bilaterally; no cyanosis or clubbing.  Discharge Instructions   Discharge Instructions    Diet - low sodium heart healthy   Complete by: As directed    Discharge instructions   Complete by: As directed    Follow heart healthy/soft diet (especially be meticulous about low-fat) Maintain adequate hydration Take medications as prescribed Follow-up with PCP in 10 days.     Allergies as of 09/23/2019   No Known Allergies     Medication List    TAKE these medications   acetaminophen 325 MG tablet Commonly known as: TYLENOL Take 2 tablets (650 mg total) by mouth every 6 (six) hours as needed (pain or Fever >/= 101).   aspirin EC 81 MG tablet Take 81 mg by mouth daily.   B-12 PO Take 1 tablet by mouth daily.   cholecalciferol 25 MCG (1000 UNIT) tablet Commonly known as: VITAMIN D3 Take 1,000 Units by mouth daily.   citalopram 40 MG tablet Commonly known as: CELEXA Take 40 mg by mouth daily.   co-enzyme Q-10 30 MG capsule Take 30 mg by mouth daily.   Fish Oil 1000 MG Caps Take  1,000 mg by mouth daily.   ondansetron 8 MG disintegrating tablet Commonly known as: Zofran ODT Take 1 tablet (8 mg total) by mouth every 8 (eight) hours as needed for nausea or vomiting.   pantoprazole 20 MG tablet Commonly known as: Protonix Take 1 tablet (20 mg total) by mouth daily.   pravastatin 40 MG tablet Commonly known as: PRAVACHOL Take 40 mg by mouth daily.      No Known Allergies Follow-up Information    Derek Nevins, MD Follow up in 10 day(s).   Specialty: Internal Medicine Contact information: [address] [place] [address] [phone]        Laqueta Linden, MD .   Specialty: Cardiology Contact information: [address] [place] [address] [phone]           The results of significant diagnostics from this hospitalization (including imaging, microbiology, ancillary and laboratory) are listed below for reference.    Significant Diagnostic Studies: CT ABDOMEN PELVIS W CONTRAST  Result Date: 09/22/2019 CLINICAL DATA:  Generalized abdominal pain began at 07:00 EXAM: CT ABDOMEN AND PELVIS WITH CONTRAST TECHNIQUE: Multidetector CT imaging of the abdomen and pelvis was performed using the standard protocol following bolus administration of intravenous contrast. CONTRAST:  OMNIPAQUE IOHEXOL 300 MG/ML  SOLN COMPARISON:  None. FINDINGS: Lower chest: Lung bases are clear. Normal heart size. No pericardial effusion. Hepatobiliary: 1.3 cm hypoattenuating focus along the left lobe liver margin (2/26) with some nodular discontinuous peripheral enhancement has  an appearance most compatible with benign hepatic hemangioma. No focal concerning liver lesions. No biliary ductal dilatation. Normal gallbladder. No visible calcified gallstones or intraductal gallstones. Pancreas: There is partial fatty replacement of the pancreas. Edematous and inflammatory changes are present centered upon the pancreatic head and to a lesser extent uncinate  process. No organized fluid collection or parenchymal hypoattenuation is evident to suggest acute pancreatic necrosis. No ductal dilatation. Spleen: Normal in size without focal abnormality. Adrenals/Urinary Tract: Normal adrenal glands. Fluid attenuation 3.4 cm cyst seen in the interpolar left kidney. Additional 2 cm fluid attenuation cyst seen in the lower pole right kidney. Few smaller subcentimeter hypoattenuating foci in both kidney too small to fully characterize on CT imaging but statistically likely benign. Mild bilateral symmetric perinephric stranding, a nonspecific finding though may correlate with either age or decreased renal function. Kidneys are otherwise unremarkable, without renal calculi, suspicious lesion, or hydronephrosis. Bladder is unremarkable. Kidneys enhance and excrete symmetrically. Stomach/Bowel: Distal esophagus and stomach are unremarkable. Some mild thickening of the second and third portion of the duodenal sweep may be reactive to the adjacent pancreatic inflammation detailed above. More distal duodenum and small bowel are free of mural thickening or dilatation. Cecum is slightly mobile, displaced superiorly. A normal appendix is visualized. Proximal colon is free of mural thickening or dilatation. Some mild low attenuation mural thickening of the distal colon may reflect intramural fat which can be secondary to body habitus or sequela of prior inflammatory bowel disease. Vascular/Lymphatic: The aorta is normal caliber. Reactive adenopathy in the upper abdomen and porta hepatis. No pathologically enlarged nodes in the abdomen or pelvis. Reproductive: Normal prostate and seminal vesicles. Possible varicoceles of the superior scrotum (2/114). Other: Bilateral fat containing inguinal hernias, left greater than right. Small fat containing umbilical hernia present as well. No bowel containing hernia. No abdominopelvic free fluid or air. Musculoskeletal: Multilevel degenerative changes are  present in the imaged portions of the spine. No acute osseous abnormality or suspicious osseous lesion. IMPRESSION: 1. Edematous and inflammatory changes centered upon the pancreatic head and to a lesser extent uncinate process, consistent with acute interstitial edematous pancreatitis. No organized fluid collection or parenchymal hypoattenuation is evident to suggest acute pancreatic necrosis. Recommend correlation with lipase. 2. Some mild thickening of the second and third portion of the duodenal sweep may be reactive to the adjacent pancreatic inflammation detailed above. 3. Bilateral fat containing inguinal hernias, left greater than right. Small fat containing umbilical hernia. 4. Possible varicoceles of the superior scrotum. Correlate with symptomatology and clinical exam with scrotal ultrasound, if clinically warranted, on a nonemergent, outpatient basis. 5. Some mild low attenuation mural thickening of the distal colon may reflect intramural fat which can be secondary to body habitus or sequela of prior inflammatory bowel disease. Correlate with patient history. 6. 1.3 cm hypoattenuating focus along the left lobe liver margin with some nodular discontinuous peripheral enhancement has an appearance most compatible with benign hepatic hemangioma. In the absence of risk factors, no further imaging follow-up is warranted. Otherwise, outpatient MRI with contrast could further evaluate. This recommendation follows ACR consensus guidelines: Management of Incidental Liver Lesions on CT: A White Paper of the ACR Incidental Findings Committee. J Am Coll Radiol 2017; 82:6415-8309. Electronically Signed   By: Kreg Shropshire M.D.   On: 09/22/2019 22:18    Microbiology: No results found for this or any previous visit (from the past 240 hour(s)).   Labs: Basic Metabolic Panel: Recent Labs  Lab 09/22/19 1955 09/23/19 4076  NA 136 138  K 3.7 3.8  CL 104 106  CO2 23 24  GLUCOSE 142* 114*  BUN 13 9  CREATININE  0.85 0.81  CALCIUM 8.9 8.3*  MG 2.1  --   PHOS 2.7  --    Liver Function Tests: Recent Labs  Lab 09/22/19 1955 09/23/19 0648  AST 17 13*  ALT 32 25  ALKPHOS 76 65  BILITOT 0.6 0.5  PROT 7.5 6.7  ALBUMIN 4.0 3.4*   Recent Labs  Lab 09/22/19 1955 09/23/19 0648  LIPASE 92* 49   CBC: Recent Labs  Lab 09/22/19 1955 09/23/19 0648  WBC 21.1* 19.0*  NEUTROABS  --  15.8*  HGB 14.2 13.3  HCT 43.1 40.5  MCV 89.4 90.6  PLT 302 278    Signed:  Barton Dubois MD.  [facility] 09/23/2019, 3:24 PM

## 2019-09-23 NOTE — Progress Notes (Signed)
Nsg Discharge Note  Admit Date:  09/22/2019 Discharge date: 09/23/2019   Derek Villegas to be D/C'd Home per MD order.  AVS completed.  Patient able to verbalize understanding.  Discharge Medication: Allergies as of 09/23/2019   No Known Allergies     Medication List    TAKE these medications   acetaminophen 325 MG tablet Commonly known as: TYLENOL Take 2 tablets (650 mg total) by mouth every 6 (six) hours as needed (pain or Fever >/= 101).   aspirin EC 81 MG tablet Take 81 mg by mouth daily.   B-12 PO Take 1 tablet by mouth daily.   cholecalciferol 25 MCG (1000 UNIT) tablet Commonly known as: VITAMIN D3 Take 1,000 Units by mouth daily.   citalopram 40 MG tablet Commonly known as: CELEXA Take 40 mg by mouth daily.   co-enzyme Q-10 30 MG capsule Take 30 mg by mouth daily.   Fish Oil 1000 MG Caps Take 1,000 mg by mouth daily.   ondansetron 8 MG disintegrating tablet Commonly known as: Zofran ODT Take 1 tablet (8 mg total) by mouth every 8 (eight) hours as needed for nausea or vomiting.   pantoprazole 20 MG tablet Commonly known as: Protonix Take 1 tablet (20 mg total) by mouth daily.   pravastatin 40 MG tablet Commonly known as: PRAVACHOL Take 40 mg by mouth daily.       Discharge Assessment: Vitals:   09/23/19 0914 09/23/19 1251  BP: 124/73 114/79  Pulse: 83 76  Resp: 18 16  Temp: 98.4 F (36.9 C) 98.3 F (36.8 C)  SpO2: 91% 93%   Skin clean, dry and intact without evidence of skin break down, no evidence of skin tears noted. IV catheter discontinued intact. Site without signs and symptoms of complications - no redness or edema noted at insertion site, patient denies c/o pain - only slight tenderness at site.  Dressing with slight pressure applied.  D/c Instructions-Education: Discharge instructions given to patient with verbalized understanding. D/c education completed with patient including follow up instructions, medication list, d/c activities  limitations if indicated, with other d/c instructions as indicated by MD - patient able to verbalize understanding, all questions fully answered. Patient instructed to return to ED, call 911, or call MD for any changes in condition.  Patient escorted via WC, and D/C home via private auto.  Jethro Poling, RN 09/23/2019 3:50 PM

## 2019-10-13 DIAGNOSIS — K859 Acute pancreatitis without necrosis or infection, unspecified: Secondary | ICD-10-CM | POA: Diagnosis not present

## 2019-10-13 DIAGNOSIS — Z6839 Body mass index (BMI) 39.0-39.9, adult: Secondary | ICD-10-CM | POA: Diagnosis not present

## 2019-10-13 DIAGNOSIS — E669 Obesity, unspecified: Secondary | ICD-10-CM | POA: Diagnosis not present

## 2019-10-13 DIAGNOSIS — Z1389 Encounter for screening for other disorder: Secondary | ICD-10-CM | POA: Diagnosis not present

## 2019-10-13 DIAGNOSIS — G4733 Obstructive sleep apnea (adult) (pediatric): Secondary | ICD-10-CM | POA: Diagnosis not present

## 2020-09-12 DIAGNOSIS — L03115 Cellulitis of right lower limb: Secondary | ICD-10-CM | POA: Diagnosis not present

## 2020-09-12 DIAGNOSIS — L03116 Cellulitis of left lower limb: Secondary | ICD-10-CM | POA: Diagnosis not present

## 2020-09-12 DIAGNOSIS — R601 Generalized edema: Secondary | ICD-10-CM | POA: Diagnosis not present

## 2020-09-12 DIAGNOSIS — R609 Edema, unspecified: Secondary | ICD-10-CM | POA: Diagnosis not present

## 2020-09-12 DIAGNOSIS — L0390 Cellulitis, unspecified: Secondary | ICD-10-CM | POA: Diagnosis not present

## 2020-09-13 DIAGNOSIS — I872 Venous insufficiency (chronic) (peripheral): Secondary | ICD-10-CM | POA: Diagnosis not present

## 2020-09-13 DIAGNOSIS — M79604 Pain in right leg: Secondary | ICD-10-CM | POA: Diagnosis not present

## 2020-09-13 DIAGNOSIS — Z1331 Encounter for screening for depression: Secondary | ICD-10-CM | POA: Diagnosis not present

## 2020-09-13 DIAGNOSIS — R600 Localized edema: Secondary | ICD-10-CM | POA: Diagnosis not present

## 2020-09-13 DIAGNOSIS — Z6841 Body Mass Index (BMI) 40.0 and over, adult: Secondary | ICD-10-CM | POA: Diagnosis not present

## 2020-09-19 DIAGNOSIS — Z6841 Body Mass Index (BMI) 40.0 and over, adult: Secondary | ICD-10-CM | POA: Diagnosis not present

## 2020-09-19 DIAGNOSIS — E7849 Other hyperlipidemia: Secondary | ICD-10-CM | POA: Diagnosis not present

## 2020-09-19 DIAGNOSIS — L03115 Cellulitis of right lower limb: Secondary | ICD-10-CM | POA: Diagnosis not present

## 2020-09-19 DIAGNOSIS — T50905A Adverse effect of unspecified drugs, medicaments and biological substances, initial encounter: Secondary | ICD-10-CM | POA: Diagnosis not present

## 2020-10-01 DIAGNOSIS — Z6841 Body Mass Index (BMI) 40.0 and over, adult: Secondary | ICD-10-CM | POA: Diagnosis not present

## 2020-10-01 DIAGNOSIS — Z0001 Encounter for general adult medical examination with abnormal findings: Secondary | ICD-10-CM | POA: Diagnosis not present

## 2020-10-01 DIAGNOSIS — E7849 Other hyperlipidemia: Secondary | ICD-10-CM | POA: Diagnosis not present

## 2020-10-01 DIAGNOSIS — G4733 Obstructive sleep apnea (adult) (pediatric): Secondary | ICD-10-CM | POA: Diagnosis not present

## 2020-10-01 DIAGNOSIS — I872 Venous insufficiency (chronic) (peripheral): Secondary | ICD-10-CM | POA: Diagnosis not present

## 2020-10-01 DIAGNOSIS — Z125 Encounter for screening for malignant neoplasm of prostate: Secondary | ICD-10-CM | POA: Diagnosis not present

## 2022-01-01 DIAGNOSIS — Z1389 Encounter for screening for other disorder: Secondary | ICD-10-CM | POA: Diagnosis not present

## 2022-01-01 DIAGNOSIS — Z6841 Body Mass Index (BMI) 40.0 and over, adult: Secondary | ICD-10-CM | POA: Diagnosis not present

## 2022-01-01 DIAGNOSIS — Z0001 Encounter for general adult medical examination with abnormal findings: Secondary | ICD-10-CM | POA: Diagnosis not present

## 2022-01-01 DIAGNOSIS — E559 Vitamin D deficiency, unspecified: Secondary | ICD-10-CM | POA: Diagnosis not present

## 2022-01-01 DIAGNOSIS — Z1331 Encounter for screening for depression: Secondary | ICD-10-CM | POA: Diagnosis not present

## 2022-01-01 DIAGNOSIS — R600 Localized edema: Secondary | ICD-10-CM | POA: Diagnosis not present

## 2022-01-01 DIAGNOSIS — E538 Deficiency of other specified B group vitamins: Secondary | ICD-10-CM | POA: Diagnosis not present

## 2022-01-01 DIAGNOSIS — R7309 Other abnormal glucose: Secondary | ICD-10-CM | POA: Diagnosis not present

## 2022-01-01 DIAGNOSIS — I872 Venous insufficiency (chronic) (peripheral): Secondary | ICD-10-CM | POA: Diagnosis not present

## 2022-01-01 DIAGNOSIS — E782 Mixed hyperlipidemia: Secondary | ICD-10-CM | POA: Diagnosis not present

## 2023-01-11 DIAGNOSIS — E782 Mixed hyperlipidemia: Secondary | ICD-10-CM | POA: Diagnosis not present

## 2023-01-11 DIAGNOSIS — E538 Deficiency of other specified B group vitamins: Secondary | ICD-10-CM | POA: Diagnosis not present

## 2023-01-11 DIAGNOSIS — Z9229 Personal history of other drug therapy: Secondary | ICD-10-CM | POA: Diagnosis not present

## 2023-01-11 DIAGNOSIS — R600 Localized edema: Secondary | ICD-10-CM | POA: Diagnosis not present

## 2023-01-11 DIAGNOSIS — Z0001 Encounter for general adult medical examination with abnormal findings: Secondary | ICD-10-CM | POA: Diagnosis not present

## 2023-01-11 DIAGNOSIS — E7849 Other hyperlipidemia: Secondary | ICD-10-CM | POA: Diagnosis not present

## 2023-01-11 DIAGNOSIS — I872 Venous insufficiency (chronic) (peripheral): Secondary | ICD-10-CM | POA: Diagnosis not present

## 2023-01-11 DIAGNOSIS — R7309 Other abnormal glucose: Secondary | ICD-10-CM | POA: Diagnosis not present

## 2023-01-11 DIAGNOSIS — Z1331 Encounter for screening for depression: Secondary | ICD-10-CM | POA: Diagnosis not present

## 2023-01-11 DIAGNOSIS — Z6841 Body Mass Index (BMI) 40.0 and over, adult: Secondary | ICD-10-CM | POA: Diagnosis not present

## 2023-01-11 DIAGNOSIS — E559 Vitamin D deficiency, unspecified: Secondary | ICD-10-CM | POA: Diagnosis not present

## 2023-02-04 ENCOUNTER — Inpatient Hospital Stay: Payer: BC Managed Care – PPO | Attending: Oncology | Admitting: Oncology

## 2023-02-04 ENCOUNTER — Inpatient Hospital Stay: Payer: BC Managed Care – PPO

## 2023-02-04 ENCOUNTER — Other Ambulatory Visit: Payer: Self-pay

## 2023-02-04 ENCOUNTER — Encounter: Payer: Self-pay | Admitting: Oncology

## 2023-02-04 VITALS — BP 140/96 | HR 115 | Temp 98.9°F | Resp 18 | Ht 64.0 in | Wt 329.7 lb

## 2023-02-04 DIAGNOSIS — D72829 Elevated white blood cell count, unspecified: Secondary | ICD-10-CM

## 2023-02-04 DIAGNOSIS — Z6841 Body Mass Index (BMI) 40.0 and over, adult: Secondary | ICD-10-CM | POA: Insufficient documentation

## 2023-02-04 DIAGNOSIS — E669 Obesity, unspecified: Secondary | ICD-10-CM

## 2023-02-04 LAB — CBC WITH DIFFERENTIAL/PLATELET
Abs Immature Granulocytes: 0.07 10*3/uL (ref 0.00–0.07)
Basophils Absolute: 0.1 10*3/uL (ref 0.0–0.1)
Basophils Relative: 0 %
Eosinophils Absolute: 0.1 10*3/uL (ref 0.0–0.5)
Eosinophils Relative: 1 %
HCT: 45.1 % (ref 39.0–52.0)
Hemoglobin: 15 g/dL (ref 13.0–17.0)
Immature Granulocytes: 0 %
Lymphocytes Relative: 18 %
Lymphs Abs: 2.9 10*3/uL (ref 0.7–4.0)
MCH: 29.2 pg (ref 26.0–34.0)
MCHC: 33.3 g/dL (ref 30.0–36.0)
MCV: 87.7 fL (ref 80.0–100.0)
Monocytes Absolute: 1.1 10*3/uL — ABNORMAL HIGH (ref 0.1–1.0)
Monocytes Relative: 7 %
Neutro Abs: 11.9 10*3/uL — ABNORMAL HIGH (ref 1.7–7.7)
Neutrophils Relative %: 74 %
Platelets: 390 10*3/uL (ref 150–400)
RBC: 5.14 MIL/uL (ref 4.22–5.81)
RDW: 13.4 % (ref 11.5–15.5)
WBC: 16.1 10*3/uL — ABNORMAL HIGH (ref 4.0–10.5)
nRBC: 0 % (ref 0.0–0.2)

## 2023-02-04 LAB — SEDIMENTATION RATE: Sed Rate: 15 mm/hr (ref 0–16)

## 2023-02-04 LAB — COMPREHENSIVE METABOLIC PANEL
ALT: 32 U/L (ref 0–44)
AST: 22 U/L (ref 15–41)
Albumin: 4.3 g/dL (ref 3.5–5.0)
Alkaline Phosphatase: 90 U/L (ref 38–126)
Anion gap: 10 (ref 5–15)
BUN: 16 mg/dL (ref 6–20)
CO2: 26 mmol/L (ref 22–32)
Calcium: 8.9 mg/dL (ref 8.9–10.3)
Chloride: 100 mmol/L (ref 98–111)
Creatinine, Ser: 1 mg/dL (ref 0.61–1.24)
GFR, Estimated: >60 mL/min (ref >60)
Glucose, Bld: 94 mg/dL (ref 70–99)
Potassium: 3.4 mmol/L — ABNORMAL LOW (ref 3.5–5.1)
Sodium: 136 mmol/L (ref 135–145)
Total Bilirubin: 1 mg/dL (ref 0.3–1.2)
Total Protein: 8 g/dL (ref 6.5–8.1)

## 2023-02-04 LAB — TECHNOLOGIST SMEAR REVIEW: WBC MORPHOLOGY: REACTIVE

## 2023-02-04 LAB — LACTATE DEHYDROGENASE: LDH: 113 U/L (ref 98–192)

## 2023-02-04 NOTE — Assessment & Plan Note (Signed)
Patient has morbid obesity with a BMI of 56.6 Discussed losing weight and lifestyle modifications Patient is trying to lose weight and will follow-up

## 2023-02-04 NOTE — Progress Notes (Signed)
[facility] HEMATOLOGY NEW VISIT  [name], MD  REASON FOR REFERRAL: Leukocytosis   HISTORY OF PRESENT ILLNESS: Derek Villegas 57 y.o. male referred by his primary care for leukocytosis.  He has a past medical history of obesity, hyperlipidemia and GERD.Labs from his primary care showed a WBC of 12.1 on 01/18/2023, which was neutrophil predominant of 75%.  Labs reviewed from here showed a leukocytosis since 2021 when he had a WBC of 21.1.  Patient has no complaints today.  He denies weight loss, fever, chills, abdominal pain, night sweats, loss of appetite and urinary symptoms.  He is obese with a BMI of 56.5 and has been trying to lose weight.  He reported that he had pancreatitis more than 3 years ago, etiology of which was unknown, had left leg cellulitis around 3 years ago.  He has no other recent infections.  He is not taking steroids.  He is a non-smoker, nonalcoholic and has no family history of cancer or blood conditions.  He is married and lives in [place] with his wife and daughter.  He is fully functional.  I have reviewed the past medical history, past surgical history, social history and family history with the patient   ALLERGIES:  has No Known Allergies.  MEDICATIONS:  Current Outpatient Medications  Medication Sig Dispense Refill   acetaminophen (TYLENOL) 325 MG tablet Take 2 tablets (650 mg total) by mouth every 6 (six) hours as needed (pain or Fever >/= 101). 40 tablet 0   aspirin EC 81 MG tablet Take 81 mg by mouth daily.     cholecalciferol (VITAMIN D3) 25 MCG (1000 UNIT) tablet Take 1,000 Units by mouth daily.     citalopram (CELEXA) 40 MG tablet Take 40 mg by mouth daily.     co-enzyme Q-10 30 MG capsule Take 30 mg by mouth daily.     Cyanocobalamin (B-12 PO) Take 1 tablet by mouth daily.     Omega-3 Fatty Acids (FISH OIL) 1000 MG CAPS Take 1,000 mg by mouth daily.     ondansetron (ZOFRAN ODT) 8 MG disintegrating tablet  Take 1 tablet (8 mg total) by mouth every 8 (eight) hours as needed for nausea or vomiting. 20 tablet 0   pravastatin (PRAVACHOL) 40 MG tablet Take 40 mg by mouth daily.     pantoprazole (PROTONIX) 20 MG tablet Take 1 tablet (20 mg total) by mouth daily. 30 tablet 1   No current facility-administered medications for this visit.     REVIEW OF SYSTEMS:   Constitutional: Denies fevers, chills or night sweats Eyes: Denies blurriness of vision Ears, nose, mouth, throat, and face: Denies mucositis or sore throat Respiratory: Denies cough, dyspnea or wheezes Cardiovascular: Denies palpitation, chest discomfort or lower extremity swelling Gastrointestinal:  Denies nausea, heartburn or change in bowel habits Skin: Denies abnormal skin rashes Lymphatics: Denies new lymphadenopathy or easy bruising Neurological:Denies numbness, tingling or new weaknesses Behavioral/Psych: Mood is stable, no new changes  All other systems were reviewed with the patient and are negative.  PHYSICAL EXAMINATION:   Vitals:   02/04/23 1426  BP: (!) 140/96  Pulse: (!) 115  Resp: 18  Temp: 98.9 F (37.2 C)  SpO2: 97%    GENERAL:alert, no distress and comfortable, obese SKIN: skin color, texture, turgor are normal, no rashes or significant lesions EYES: normal, Conjunctiva are pink and non-injected, sclera clear NECK: supple, thyroid normal size, non-tender, without nodularity LYMPH:  no palpable lymphadenopathy in the  cervical, axillary or inguinal LUNGS: clear to auscultation and percussion with normal breathing effort HEART: regular rate & rhythm and no murmurs and no lower extremity edema ABDOMEN:abdomen soft, non-tender and normal bowel sounds Musculoskeletal:no cyanosis of digits and no clubbing  NEURO: alert & oriented x 3 with fluent speech  LABORATORY DATA:  I have reviewed the data as listed and under media from primary care  Lab Results  Component Value Date   WBC 16.1 (H) 02/04/2023    NEUTROABS 11.9 (H) 02/04/2023   HGB 15.0 02/04/2023   HCT 45.1 02/04/2023   MCV 87.7 02/04/2023   PLT 390 02/04/2023      Component Value Date/Time   NA 136 02/04/2023 1455   K 3.4 (L) 02/04/2023 1455   CL 100 02/04/2023 1455   CO2 26 02/04/2023 1455   GLUCOSE 94 02/04/2023 1455   BUN 16 02/04/2023 1455   CREATININE 1.00 02/04/2023 1455   CALCIUM 8.9 02/04/2023 1455   PROT 8.0 02/04/2023 1455   ALBUMIN 4.3 02/04/2023 1455   AST 22 02/04/2023 1455   ALT 32 02/04/2023 1455   ALKPHOS 90 02/04/2023 1455   BILITOT 1.0 02/04/2023 1455   GFRNONAA >60 02/04/2023 1455   GFRAA >60 09/23/2019 0648       Chemistry      Component Value Date/Time   NA 136 02/04/2023 1455   K 3.4 (L) 02/04/2023 1455   CL 100 02/04/2023 1455   CO2 26 02/04/2023 1455   BUN 16 02/04/2023 1455   CREATININE 1.00 02/04/2023 1455      Component Value Date/Time   CALCIUM 8.9 02/04/2023 1455   ALKPHOS 90 02/04/2023 1455   AST 22 02/04/2023 1455   ALT 32 02/04/2023 1455   BILITOT 1.0 02/04/2023 1455       ASSESSMENT & PLAN:  Patient is a 57 year old male referred for leukocytosis  Leukocytosis Patient has a longstanding history of leukocytosis. Isolated, asymptomatic leukocytosis has been reported in patients with morbid obesity which is likely happening this patient He has no B symptoms leukemia/lymphoma Will repeat labs today along with an LDH Will also workup for inflammatory conditions with an ESR and CRP Will look at the peripheral smear If everything stable, will repeat labs in 3 months before follow-up  Obesity, Class II, BMI 35-39.9 Patient has morbid obesity with a BMI of 56.6 Discussed losing weight and lifestyle modifications Patient is trying to lose weight and will follow-up   Orders Placed This Encounter  Procedures   CBC with Differential/Platelet    Standing Status:   Future    Number of Occurrences:   1    Standing Expiration Date:   02/04/2024   Comprehensive metabolic  panel    Standing Status:   Future    Number of Occurrences:   1    Standing Expiration Date:   02/04/2024   Lactate dehydrogenase    Standing Status:   Future    Number of Occurrences:   1    Standing Expiration Date:   02/04/2024    The total time spent in the appointment was 45 minutes encounter with patients including review of chart and various tests results, discussions about plan of care and coordination of care plan and documentation   All questions were answered. The patient knows to call the clinic with any problems, questions or concerns. No barriers to learning was detected.   Cindie Crumbly, MD 8/29/20243:57 PM

## 2023-02-04 NOTE — Assessment & Plan Note (Signed)
Patient has a longstanding history of leukocytosis. Isolated, asymptomatic leukocytosis has been reported in patients with morbid obesity which is likely happening this patient He has no B symptoms leukemia/lymphoma Will repeat labs today along with an LDH Will also workup for inflammatory conditions with an ESR and CRP Will look at the peripheral smear If everything stable, will repeat labs in 3 months before follow-up

## 2023-05-11 ENCOUNTER — Inpatient Hospital Stay: Payer: BC Managed Care – PPO

## 2023-05-19 ENCOUNTER — Inpatient Hospital Stay: Payer: BC Managed Care – PPO | Admitting: Oncology

## 2023-06-17 ENCOUNTER — Inpatient Hospital Stay: Payer: BC Managed Care – PPO

## 2023-06-24 ENCOUNTER — Inpatient Hospital Stay: Payer: BC Managed Care – PPO | Admitting: Oncology

## 2023-06-25 ENCOUNTER — Inpatient Hospital Stay: Payer: BC Managed Care – PPO

## 2023-07-02 ENCOUNTER — Inpatient Hospital Stay: Payer: BC Managed Care – PPO | Attending: Hematology

## 2023-07-02 DIAGNOSIS — Z79899 Other long term (current) drug therapy: Secondary | ICD-10-CM | POA: Diagnosis not present

## 2023-07-02 DIAGNOSIS — Z7982 Long term (current) use of aspirin: Secondary | ICD-10-CM | POA: Diagnosis not present

## 2023-07-02 DIAGNOSIS — D72829 Elevated white blood cell count, unspecified: Secondary | ICD-10-CM | POA: Insufficient documentation

## 2023-07-02 LAB — CBC WITH DIFFERENTIAL/PLATELET
Abs Immature Granulocytes: 0.04 10*3/uL (ref 0.00–0.07)
Basophils Absolute: 0.1 10*3/uL (ref 0.0–0.1)
Basophils Relative: 1 %
Eosinophils Absolute: 0.2 10*3/uL (ref 0.0–0.5)
Eosinophils Relative: 2 %
HCT: 44.4 % (ref 39.0–52.0)
Hemoglobin: 14.4 g/dL (ref 13.0–17.0)
Immature Granulocytes: 0 %
Lymphocytes Relative: 21 %
Lymphs Abs: 2.6 10*3/uL (ref 0.7–4.0)
MCH: 28.6 pg (ref 26.0–34.0)
MCHC: 32.4 g/dL (ref 30.0–36.0)
MCV: 88.3 fL (ref 80.0–100.0)
Monocytes Absolute: 0.7 10*3/uL (ref 0.1–1.0)
Monocytes Relative: 6 %
Neutro Abs: 8.8 10*3/uL — ABNORMAL HIGH (ref 1.7–7.7)
Neutrophils Relative %: 70 %
Platelets: 364 10*3/uL (ref 150–400)
RBC: 5.03 MIL/uL (ref 4.22–5.81)
RDW: 14 % (ref 11.5–15.5)
WBC: 12.5 10*3/uL — ABNORMAL HIGH (ref 4.0–10.5)
nRBC: 0 % (ref 0.0–0.2)

## 2023-07-02 LAB — COMPREHENSIVE METABOLIC PANEL
ALT: 30 U/L (ref 0–44)
AST: 24 U/L (ref 15–41)
Albumin: 3.7 g/dL (ref 3.5–5.0)
Alkaline Phosphatase: 81 U/L (ref 38–126)
Anion gap: 10 (ref 5–15)
BUN: 10 mg/dL (ref 6–20)
CO2: 28 mmol/L (ref 22–32)
Calcium: 8.9 mg/dL (ref 8.9–10.3)
Chloride: 102 mmol/L (ref 98–111)
Creatinine, Ser: 0.84 mg/dL (ref 0.61–1.24)
GFR, Estimated: >60 mL/min (ref >60)
Glucose, Bld: 114 mg/dL — ABNORMAL HIGH (ref 70–99)
Potassium: 3.8 mmol/L (ref 3.5–5.1)
Sodium: 140 mmol/L (ref 135–145)
Total Bilirubin: 0.7 mg/dL (ref 0.0–1.2)
Total Protein: 7.2 g/dL (ref 6.5–8.1)

## 2023-07-02 LAB — C-REACTIVE PROTEIN: CRP: 0.7 mg/dL (ref <1.0)

## 2023-07-02 LAB — SEDIMENTATION RATE: Sed Rate: 14 mm/h (ref 0–16)

## 2023-07-08 ENCOUNTER — Inpatient Hospital Stay: Payer: BC Managed Care – PPO | Admitting: Oncology

## 2023-07-09 ENCOUNTER — Inpatient Hospital Stay (HOSPITAL_BASED_OUTPATIENT_CLINIC_OR_DEPARTMENT_OTHER): Payer: BC Managed Care – PPO | Admitting: Oncology

## 2023-07-09 VITALS — BP 150/98 | HR 103 | Temp 98.0°F | Resp 20

## 2023-07-09 DIAGNOSIS — D72829 Elevated white blood cell count, unspecified: Secondary | ICD-10-CM | POA: Diagnosis not present

## 2023-07-09 DIAGNOSIS — E66812 Obesity, class 2: Secondary | ICD-10-CM

## 2023-07-09 DIAGNOSIS — Z7982 Long term (current) use of aspirin: Secondary | ICD-10-CM | POA: Diagnosis not present

## 2023-07-09 DIAGNOSIS — Z79899 Other long term (current) drug therapy: Secondary | ICD-10-CM | POA: Diagnosis not present

## 2023-07-09 DIAGNOSIS — D72825 Bandemia: Secondary | ICD-10-CM

## 2023-07-09 NOTE — Progress Notes (Signed)
[facility] at [facility] HEMATOLOGY FOLLOW-UP VISIT  Derek Nevins, MD  REASON FOR FOLLOW-UP: Leukocytosis  ASSESSMENT & PLAN:  Patient is a 58 year old male following for leukocytosis   Leukocytosis Patient has a longstanding history of leukocytosis. White blood cell count elevated since 2021, currently 12.5 (improved from 21). No associated symptoms such as fever, weight loss, night sweats, or abdominal pain. Inflammatory markers and LDH were negative. No other blood cell lines are low. Isolated, asymptomatic leukocytosis has been reported in patients with morbid obesity which is likely happening this patient. -Continue to monitor the leukocytosis. -Return for follow-up in 6 months. -Contact the office if any concerning symptoms arise in the interim.  Obesity, Class II, BMI 35-39.9 Patient has morbid obesity with a BMI of 56.6 Discussed losing weight and lifestyle modifications Patient is trying to lose weight and will follow-up   Orders Placed This Encounter  Procedures   CBC with Differential/Platelet    Standing Status:   Future    Expected Date:   01/07/2024    Expiration Date:   07/08/2024   Comprehensive metabolic panel    Standing Status:   Future    Expected Date:   01/07/2024    Expiration Date:   07/08/2024   Flow Cytometry, Peripheral Blood (Oncology)    Standing Status:   Future    Expected Date:   01/06/2024    Expiration Date:   07/08/2024    The total time spent in the appointment was 15 minutes encounter with patients including review of chart and various tests results, discussions about plan of care and coordination of care plan   All questions were answered. The patient knows to call the clinic with any problems, questions or concerns. No barriers to learning was detected.  Cindie Crumbly, MD 1/31/20253:00 PM   SUMMARY OF HEMATOLOGIC HISTORY:  Latest Reference Range & Units 09/22/19 19:55 09/23/19 06:48 02/04/23 14:55 07/02/23  08:57  WBC 4.0 - 10.5 K/uL 21.1 (H) 19.0 (H) 16.1 (H) 12.5 (H)  (H): Data is abnormally high - Normal :LDH, ESR,CRP   INTERVAL HISTORY: Derek Villegas 58 y.o. male is following for leukocytosis.He denies recent fevers, weight loss, night sweats, and abdominal pain. He has no other complaints or pain. The patient's white count, which was 21 in 2021, has improved to 12.5, slightly above normal.   I have reviewed the past medical history, past surgical history, social history and family history with the patient   ALLERGIES:  has no known allergies.  MEDICATIONS:  Current Outpatient Medications  Medication Sig Dispense Refill   acetaminophen (TYLENOL) 325 MG tablet Take 2 tablets (650 mg total) by mouth every 6 (six) hours as needed (pain or Fever >/= 101). 40 tablet 0   aspirin EC 81 MG tablet Take 81 mg by mouth daily.     cholecalciferol (VITAMIN D3) 25 MCG (1000 UNIT) tablet Take 1,000 Units by mouth daily.     citalopram (CELEXA) 40 MG tablet Take 40 mg by mouth daily.     co-enzyme Q-10 30 MG capsule Take 30 mg by mouth daily.     Cyanocobalamin (B-12 PO) Take 1 tablet by mouth daily.     Omega-3 Fatty Acids (FISH OIL) 1000 MG CAPS Take 1,000 mg by mouth daily.     ondansetron (ZOFRAN ODT) 8 MG disintegrating tablet Take 1 tablet (8 mg total) by mouth every 8 (eight) hours as needed for nausea or vomiting. 20 tablet 0   pravastatin (PRAVACHOL)  40 MG tablet Take 40 mg by mouth daily.     pantoprazole (PROTONIX) 20 MG tablet Take 1 tablet (20 mg total) by mouth daily. 30 tablet 1   No current facility-administered medications for this visit.     REVIEW OF SYSTEMS:   Constitutional: Denies fevers, chills or night sweats Eyes: Denies blurriness of vision Ears, nose, mouth, throat, and face: Denies mucositis or sore throat Respiratory: Denies cough, dyspnea or wheezes Cardiovascular: Denies palpitation, chest discomfort or lower extremity swelling Gastrointestinal:  Denies nausea,  heartburn or change in bowel habits Skin: Denies abnormal skin rashes Lymphatics: Denies new lymphadenopathy or easy bruising Neurological:Denies numbness, tingling or new weaknesses Behavioral/Psych: Mood is stable, no new changes  All other systems were reviewed with the patient and are negative.  PHYSICAL EXAMINATION:   Vitals:   07/09/23 0915  BP: (!) 150/98  Pulse: (!) 103  Resp: 20  Temp: 98 F (36.7 C)  SpO2: 96%    GENERAL:alert, no distress and comfortable LUNGS: clear to auscultation and percussion with normal breathing effort HEART: regular rate & rhythm and no murmurs and no lower extremity edema ABDOMEN:abdomen soft, non-tender and normal bowel sounds Musculoskeletal:no cyanosis of digits and no clubbing  NEURO: alert & oriented x 3 with fluent speech  LABORATORY DATA:  I have reviewed the data as listed  Lab Results  Component Value Date   WBC 12.5 (H) 07/02/2023   NEUTROABS 8.8 (H) 07/02/2023   HGB 14.4 07/02/2023   HCT 44.4 07/02/2023   MCV 88.3 07/02/2023   PLT 364 07/02/2023      Component Value Date/Time   NA 140 07/02/2023 0857   K 3.8 07/02/2023 0857   CL 102 07/02/2023 0857   CO2 28 07/02/2023 0857   GLUCOSE 114 (H) 07/02/2023 0857   BUN 10 07/02/2023 0857   CREATININE 0.84 07/02/2023 0857   CALCIUM 8.9 07/02/2023 0857   PROT 7.2 07/02/2023 0857   ALBUMIN 3.7 07/02/2023 0857   AST 24 07/02/2023 0857   ALT 30 07/02/2023 0857   ALKPHOS 81 07/02/2023 0857   BILITOT 0.7 07/02/2023 0857   GFRNONAA >60 07/02/2023 0857   GFRAA >60 09/23/2019 0648      Chemistry      Component Value Date/Time   NA 140 07/02/2023 0857   K 3.8 07/02/2023 0857   CL 102 07/02/2023 0857   CO2 28 07/02/2023 0857   BUN 10 07/02/2023 0857   CREATININE 0.84 07/02/2023 0857      Component Value Date/Time   CALCIUM 8.9 07/02/2023 0857   ALKPHOS 81 07/02/2023 0857   AST 24 07/02/2023 0857   ALT 30 07/02/2023 0857   BILITOT 0.7 07/02/2023 0857      Latest  Reference Range & Units 07/02/23 08:58  CRP <1.0 mg/dL 0.7    Latest Reference Range & Units 02/04/23 14:55  LDH 98 - 192 U/L 113    Latest Reference Range & Units 02/04/23 14:55 07/02/23 08:57  Sed Rate 0 - 16 mm/hr 15 14

## 2023-07-09 NOTE — Assessment & Plan Note (Signed)
Patient has morbid obesity with a BMI of 56.6 Discussed losing weight and lifestyle modifications Patient is trying to lose weight and will follow-up

## 2023-07-09 NOTE — Assessment & Plan Note (Signed)
Patient has a longstanding history of leukocytosis. White blood cell count elevated since 2021, currently 12.5 (improved from 21). No associated symptoms such as fever, weight loss, night sweats, or abdominal pain. Inflammatory markers and LDH were negative. No other blood cell lines are low. Isolated, asymptomatic leukocytosis has been reported in patients with morbid obesity which is likely happening this patient. -Continue to monitor the leukocytosis. -Return for follow-up in 6 months. -Contact the office if any concerning symptoms arise in the interim.

## 2023-07-09 NOTE — Patient Instructions (Signed)
VISIT SUMMARY:  You came in today for a routine follow-up regarding your elevated white blood cell count, which has been an issue since 2021. You reported no recent symptoms such as fevers, weight loss, night sweats, or abdominal pain. Your white blood cell count has improved from 21 in 2021 to 12.5, which is slightly above normal. Tests for inflammatory markers and leukemia enzymes were negative, and you have no other symptoms or abnormal blood cell counts.  YOUR PLAN:  -PERSISTENT LEUKOCYTOSIS: Persistent leukocytosis means that your white blood cell count remains higher than normal. Your current count is 12.5, which is an improvement from 21 in 2021. Since you have no symptoms and other tests are normal, we will continue to monitor your condition. Please return for a follow-up in 6 months and contact the office if you experience any concerning symptoms.  INSTRUCTIONS:  Please return for a follow-up appointment in 6 months. Contact our office if you experience any new or concerning symptoms before then.

## 2023-08-26 DIAGNOSIS — E782 Mixed hyperlipidemia: Secondary | ICD-10-CM | POA: Diagnosis not present

## 2023-08-26 DIAGNOSIS — Z6841 Body Mass Index (BMI) 40.0 and over, adult: Secondary | ICD-10-CM | POA: Diagnosis not present

## 2023-12-30 ENCOUNTER — Inpatient Hospital Stay: Payer: BC Managed Care – PPO | Attending: Hematology

## 2024-01-06 ENCOUNTER — Inpatient Hospital Stay: Payer: BC Managed Care – PPO | Admitting: Oncology

## 2024-01-13 ENCOUNTER — Inpatient Hospital Stay: Attending: Hematology

## 2024-01-13 DIAGNOSIS — I159 Secondary hypertension, unspecified: Secondary | ICD-10-CM | POA: Insufficient documentation

## 2024-01-13 DIAGNOSIS — Z7982 Long term (current) use of aspirin: Secondary | ICD-10-CM | POA: Diagnosis not present

## 2024-01-13 DIAGNOSIS — Z79899 Other long term (current) drug therapy: Secondary | ICD-10-CM | POA: Insufficient documentation

## 2024-01-13 DIAGNOSIS — D72829 Elevated white blood cell count, unspecified: Secondary | ICD-10-CM | POA: Diagnosis not present

## 2024-01-13 DIAGNOSIS — D72825 Bandemia: Secondary | ICD-10-CM

## 2024-01-13 LAB — CBC WITH DIFFERENTIAL/PLATELET
Abs Immature Granulocytes: 0.05 K/uL (ref 0.00–0.07)
Basophils Absolute: 0.1 K/uL (ref 0.0–0.1)
Basophils Relative: 1 %
Eosinophils Absolute: 0.3 K/uL (ref 0.0–0.5)
Eosinophils Relative: 2 %
HCT: 43.2 % (ref 39.0–52.0)
Hemoglobin: 14.3 g/dL (ref 13.0–17.0)
Immature Granulocytes: 0 %
Lymphocytes Relative: 21 %
Lymphs Abs: 2.8 K/uL (ref 0.7–4.0)
MCH: 29.4 pg (ref 26.0–34.0)
MCHC: 33.1 g/dL (ref 30.0–36.0)
MCV: 88.9 fL (ref 80.0–100.0)
Monocytes Absolute: 0.8 K/uL (ref 0.1–1.0)
Monocytes Relative: 6 %
Neutro Abs: 9.1 K/uL — ABNORMAL HIGH (ref 1.7–7.7)
Neutrophils Relative %: 70 %
Platelets: 333 K/uL (ref 150–400)
RBC: 4.86 MIL/uL (ref 4.22–5.81)
RDW: 13.7 % (ref 11.5–15.5)
WBC: 13.2 K/uL — ABNORMAL HIGH (ref 4.0–10.5)
nRBC: 0 % (ref 0.0–0.2)

## 2024-01-13 LAB — COMPREHENSIVE METABOLIC PANEL WITH GFR
ALT: 32 U/L (ref 0–44)
AST: 24 U/L (ref 15–41)
Albumin: 3.6 g/dL (ref 3.5–5.0)
Alkaline Phosphatase: 67 U/L (ref 38–126)
Anion gap: 9 (ref 5–15)
BUN: 18 mg/dL (ref 6–20)
CO2: 26 mmol/L (ref 22–32)
Calcium: 8.4 mg/dL — ABNORMAL LOW (ref 8.9–10.3)
Chloride: 105 mmol/L (ref 98–111)
Creatinine, Ser: 0.86 mg/dL (ref 0.61–1.24)
GFR, Estimated: >60 mL/min (ref >60)
Glucose, Bld: 110 mg/dL — ABNORMAL HIGH (ref 70–99)
Potassium: 4.3 mmol/L (ref 3.5–5.1)
Sodium: 140 mmol/L (ref 135–145)
Total Bilirubin: 0.5 mg/dL (ref 0.0–1.2)
Total Protein: 6.9 g/dL (ref 6.5–8.1)

## 2024-01-14 LAB — SURGICAL PATHOLOGY

## 2024-01-17 LAB — FLOW CYTOMETRY

## 2024-01-26 NOTE — Progress Notes (Signed)
 [facility] at [facility] HEMATOLOGY FOLLOW-UP VISIT  Derek Satterfield, MD  REASON FOR FOLLOW-UP: Leukocytosis  ASSESSMENT & PLAN:  Patient is a 58 year old male following for leukocytosis   No problem-specific Assessment & Plan notes found for this encounter.    No orders of the defined types were placed in this encounter.   The total time spent in the appointment was 15 minutes encounter with patients including review of chart and various tests results, discussions about plan of care and coordination of care plan   All questions were answered. The patient knows to call the clinic with any problems, questions or concerns. No barriers to learning was detected.   Derek Villegas,acting as a Neurosurgeon for Mickiel Dry, MD.,have documented all relevant documentation on the behalf of Mickiel Dry, MD,as directed by  Mickiel Dry, MD while in the presence of Mickiel Dry, MD.  ***   Derek Villegas 8/20/202511:02 AM   SUMMARY OF HEMATOLOGIC HISTORY:  Latest Reference Range & Units 09/22/19 19:55 09/23/19 06:48 02/04/23 14:55 07/02/23 08:57  WBC 4.0 - 10.5 K/uL 21.1 (H) 19.0 (H) 16.1 (H) 12.5 (H)  (H): Data is abnormally high - Normal :LDH, ESR,CRP   INTERVAL HISTORY: Derek Villegas 58 y.o. male is following for leukocytosis.   I have reviewed the past medical history, past surgical history, social history and family history with the patient   ALLERGIES:  has no known allergies.  MEDICATIONS:  Current Outpatient Medications  Medication Sig Dispense Refill   acetaminophen  (TYLENOL ) 325 MG tablet Take 2 tablets (650 mg total) by mouth every 6 (six) hours as needed (pain or Fever >/= 101). 40 tablet 0   aspirin EC 81 MG tablet Take 81 mg by mouth daily.     cholecalciferol (VITAMIN D3) 25 MCG (1000 UNIT) tablet Take 1,000 Units by mouth daily.     citalopram  (CELEXA ) 40 MG tablet Take 40 mg by mouth daily.     co-enzyme Q-10 30 MG capsule  Take 30 mg by mouth daily.     Cyanocobalamin  (B-12 PO) Take 1 tablet by mouth daily.     Omega-3 Fatty Acids (FISH OIL) 1000 MG CAPS Take 1,000 mg by mouth daily.     ondansetron  (ZOFRAN  ODT) 8 MG disintegrating tablet Take 1 tablet (8 mg total) by mouth every 8 (eight) hours as needed for nausea or vomiting. 20 tablet 0   pantoprazole  (PROTONIX ) 20 MG tablet Take 1 tablet (20 mg total) by mouth daily. 30 tablet 1   pravastatin (PRAVACHOL) 40 MG tablet Take 40 mg by mouth daily.     No current facility-administered medications for this visit.     REVIEW OF SYSTEMS:   Constitutional: Denies fevers, chills or night sweats Eyes: Denies blurriness of vision Ears, nose, mouth, throat, and face: Denies mucositis or sore throat Respiratory: Denies cough, dyspnea or wheezes Cardiovascular: Denies palpitation, chest discomfort or lower extremity swelling Gastrointestinal:  Denies nausea, heartburn or change in bowel habits Skin: Denies abnormal skin rashes Lymphatics: Denies new lymphadenopathy or easy bruising Neurological:Denies numbness, tingling or new weaknesses Behavioral/Psych: Mood is stable, no new changes  All other systems were reviewed with the patient and are negative.  PHYSICAL EXAMINATION:   There were no vitals filed for this visit.   GENERAL:alert, no distress and comfortable LUNGS: clear to auscultation and percussion with normal breathing effort HEART: regular rate & rhythm and no murmurs and no lower extremity edema ABDOMEN:abdomen soft, non-tender and normal bowel  sounds Musculoskeletal:no cyanosis of digits and no clubbing  NEURO: alert & oriented x 3 with fluent speech  LABORATORY DATA:  I have reviewed the data as listed  Lab Results  Component Value Date   WBC 13.2 (H) 01/13/2024   NEUTROABS 9.1 (H) 01/13/2024   HGB 14.3 01/13/2024   HCT 43.2 01/13/2024   MCV 88.9 01/13/2024   PLT 333 01/13/2024      Component Value Date/Time   NA 140 01/13/2024 0741    K 4.3 01/13/2024 0741   CL 105 01/13/2024 0741   CO2 26 01/13/2024 0741   GLUCOSE 110 (H) 01/13/2024 0741   BUN 18 01/13/2024 0741   CREATININE 0.86 01/13/2024 0741   CALCIUM 8.4 (L) 01/13/2024 0741   PROT 6.9 01/13/2024 0741   ALBUMIN 3.6 01/13/2024 0741   AST 24 01/13/2024 0741   ALT 32 01/13/2024 0741   ALKPHOS 67 01/13/2024 0741   BILITOT 0.5 01/13/2024 0741   GFRNONAA >60 01/13/2024 0741   GFRAA >60 09/23/2019 0648      Chemistry      Component Value Date/Time   NA 140 01/13/2024 0741   K 4.3 01/13/2024 0741   CL 105 01/13/2024 0741   CO2 26 01/13/2024 0741   BUN 18 01/13/2024 0741   CREATININE 0.86 01/13/2024 0741      Component Value Date/Time   CALCIUM 8.4 (L) 01/13/2024 0741   ALKPHOS 67 01/13/2024 0741   AST 24 01/13/2024 0741   ALT 32 01/13/2024 0741   BILITOT 0.5 01/13/2024 0741      Latest Reference Range & Units 07/02/23 08:58  CRP <1.0 mg/dL 0.7    Latest Reference Range & Units 02/04/23 14:55  LDH 98 - 192 U/L 113    Latest Reference Range & Units 02/04/23 14:55 07/02/23 08:57  Sed Rate 0 - 16 mm/hr 15 14

## 2024-01-27 ENCOUNTER — Inpatient Hospital Stay (HOSPITAL_BASED_OUTPATIENT_CLINIC_OR_DEPARTMENT_OTHER): Admitting: Oncology

## 2024-01-27 VITALS — BP 143/103 | HR 94 | Temp 98.9°F | Resp 19 | Ht 71.0 in | Wt 303.2 lb

## 2024-01-27 DIAGNOSIS — Z79899 Other long term (current) drug therapy: Secondary | ICD-10-CM | POA: Diagnosis not present

## 2024-01-27 DIAGNOSIS — D72829 Elevated white blood cell count, unspecified: Secondary | ICD-10-CM | POA: Diagnosis not present

## 2024-01-27 DIAGNOSIS — I159 Secondary hypertension, unspecified: Secondary | ICD-10-CM | POA: Diagnosis not present

## 2024-01-27 DIAGNOSIS — I10 Essential (primary) hypertension: Secondary | ICD-10-CM | POA: Insufficient documentation

## 2024-01-27 DIAGNOSIS — Z7982 Long term (current) use of aspirin: Secondary | ICD-10-CM | POA: Diagnosis not present

## 2024-01-27 NOTE — Assessment & Plan Note (Signed)
 Elevated blood pressure today with no symptoms. Blood pressure: 154/122mmHg  - Will repeat blood pressure today - Discussed with patient to decrease salt in diet -He is planning to discuss this with his primary care tomorrow

## 2024-01-27 NOTE — Assessment & Plan Note (Addendum)
 Patient has a longstanding history of leukocytosis. White blood cell count elevated since 2021, currently 13.2 (improved from 21). No associated symptoms such as fever, weight loss, night sweats, or abdominal pain.  Inflammatory markers and LDH were negative. No other blood cell lines are low.  Isolated, asymptomatic leukocytosis has been reported in patients with morbid obesity which is likely happening this patient. Flow cytometry 08/25: No immune phenotyping evidence of lymphoproliferative disorder   -Continue to monitor the leukocytosis. -Return for follow-up in 12 months. -Contact the office if any concerning symptoms arise in the interim.

## 2024-01-27 NOTE — Patient Instructions (Signed)
 [facility] at [facility] Discharge Instructions   You were seen and examined today by Dr. Davonna.  She reviewed the results of your lab work which are normal/stable.   We will see you back in one year. We will repeat lab work prior to this visit.   Return as scheduled.    Thank you for choosing [facility] at [facility] to provide your oncology and hematology care.  To afford each patient quality time with our provider, please arrive at least 15 minutes before your scheduled appointment time.   If you have a lab appointment with the Cancer Center please come in thru the Main Entrance and check in at the main information desk.  You need to re-schedule your appointment should you arrive 10 or more minutes late.  We strive to give you quality time with our providers, and arriving late affects you and other patients whose appointments are after yours.  Also, if you no show three or more times for appointments you may be dismissed from the clinic at the providers discretion.     Again, thank you for choosing [facility].  Our hope is that these requests will decrease the amount of time that you wait before being seen by our physicians.       _____________________________________________________________  Should you have questions after your visit to [facility], please contact our office at [phone] and follow the prompts.  Our office hours are 8:00 a.m. and 4:30 p.m. Monday - Friday.  Please note that voicemails left after 4:00 p.m. may not be returned until the following business day.  We are closed weekends and major holidays.  You do have access to a nurse 24-7, just call the main number to the clinic [phone] and do not press any options, hold on the line and a nurse will answer the phone.    For prescription refill requests, have your pharmacy contact our office and allow 72 hours.    Due to Covid, you will  need to wear a mask upon entering the hospital. If you do not have a mask, a mask will be given to you at the Main Entrance upon arrival. For doctor visits, patients may have 1 support person age 34 or older with them. For treatment visits, patients can not have anyone with them due to social distancing guidelines and our immunocompromised population.

## 2024-01-28 DIAGNOSIS — E782 Mixed hyperlipidemia: Secondary | ICD-10-CM | POA: Diagnosis not present

## 2024-01-28 DIAGNOSIS — Z1331 Encounter for screening for depression: Secondary | ICD-10-CM | POA: Diagnosis not present

## 2024-01-28 DIAGNOSIS — Z0001 Encounter for general adult medical examination with abnormal findings: Secondary | ICD-10-CM | POA: Diagnosis not present

## 2024-01-28 DIAGNOSIS — R7309 Other abnormal glucose: Secondary | ICD-10-CM | POA: Diagnosis not present

## 2024-01-28 DIAGNOSIS — Z125 Encounter for screening for malignant neoplasm of prostate: Secondary | ICD-10-CM | POA: Diagnosis not present

## 2024-01-28 DIAGNOSIS — Z6841 Body Mass Index (BMI) 40.0 and over, adult: Secondary | ICD-10-CM | POA: Diagnosis not present

## 2024-01-28 DIAGNOSIS — Z9229 Personal history of other drug therapy: Secondary | ICD-10-CM | POA: Diagnosis not present

## 2024-01-28 DIAGNOSIS — I872 Venous insufficiency (chronic) (peripheral): Secondary | ICD-10-CM | POA: Diagnosis not present

## 2024-03-06 DIAGNOSIS — Z299 Encounter for prophylactic measures, unspecified: Secondary | ICD-10-CM | POA: Diagnosis not present

## 2024-03-06 DIAGNOSIS — F419 Anxiety disorder, unspecified: Secondary | ICD-10-CM | POA: Diagnosis not present

## 2024-03-06 DIAGNOSIS — E7800 Pure hypercholesterolemia, unspecified: Secondary | ICD-10-CM | POA: Diagnosis not present

## 2024-03-06 DIAGNOSIS — K219 Gastro-esophageal reflux disease without esophagitis: Secondary | ICD-10-CM | POA: Diagnosis not present

## 2024-03-06 DIAGNOSIS — R350 Frequency of micturition: Secondary | ICD-10-CM | POA: Diagnosis not present

## 2024-03-06 DIAGNOSIS — R609 Edema, unspecified: Secondary | ICD-10-CM | POA: Diagnosis not present

## 2025-01-17 ENCOUNTER — Other Ambulatory Visit

## 2025-01-24 ENCOUNTER — Ambulatory Visit: Admitting: Physician Assistant
